# Patient Record
Sex: Female | Born: 1960 | Race: White | Hispanic: No | Marital: Single | Smoking: Never smoker
Health system: Southern US, Community
[De-identification: ages and names within clinical notes are randomized; demographics above are authoritative.]

## PROBLEM LIST (undated history)

## (undated) DIAGNOSIS — E079 Disorder of thyroid, unspecified: Secondary | ICD-10-CM

## (undated) DIAGNOSIS — M1990 Unspecified osteoarthritis, unspecified site: Secondary | ICD-10-CM

## (undated) DIAGNOSIS — G7000 Myasthenia gravis without (acute) exacerbation: Secondary | ICD-10-CM

## (undated) DIAGNOSIS — E063 Autoimmune thyroiditis: Secondary | ICD-10-CM

## (undated) DIAGNOSIS — R51 Headache: Secondary | ICD-10-CM

## (undated) DIAGNOSIS — R011 Cardiac murmur, unspecified: Secondary | ICD-10-CM

## (undated) DIAGNOSIS — E785 Hyperlipidemia, unspecified: Secondary | ICD-10-CM

## (undated) DIAGNOSIS — R599 Enlarged lymph nodes, unspecified: Secondary | ICD-10-CM

## (undated) DIAGNOSIS — R0981 Nasal congestion: Secondary | ICD-10-CM

## (undated) HISTORY — DX: Autoimmune thyroiditis: E06.3

## (undated) HISTORY — PX: CHOLECYSTECTOMY: SHX55

## (undated) HISTORY — DX: Cardiac murmur, unspecified: R01.1

## (undated) HISTORY — DX: Disorder of thyroid, unspecified: E07.9

## (undated) HISTORY — DX: Unspecified osteoarthritis, unspecified site: M19.90

## (undated) HISTORY — DX: Hyperlipidemia, unspecified: E78.5

## (undated) HISTORY — DX: Headache: R51

## (undated) HISTORY — DX: Nasal congestion: R09.81

## (undated) HISTORY — DX: Enlarged lymph nodes, unspecified: R59.9

---
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
## 2011-01-31 ENCOUNTER — Other Ambulatory Visit (INDEPENDENT_AMBULATORY_CARE_PROVIDER_SITE_OTHER): Payer: Self-pay | Admitting: Surgery

## 2011-01-31 DIAGNOSIS — E049 Nontoxic goiter, unspecified: Secondary | ICD-10-CM

## 2011-01-31 DIAGNOSIS — E063 Autoimmune thyroiditis: Secondary | ICD-10-CM

## 2011-02-07 ENCOUNTER — Ambulatory Visit
Admission: RE | Admit: 2011-02-07 | Discharge: 2011-02-07 | Disposition: A | Discharge status: Home / Self Care | Payer: BC Managed Care – PPO | Source: Ambulatory Visit | Attending: Surgery | Admitting: Surgery

## 2011-02-07 DIAGNOSIS — E063 Autoimmune thyroiditis: Secondary | ICD-10-CM

## 2011-02-07 DIAGNOSIS — E049 Nontoxic goiter, unspecified: Secondary | ICD-10-CM

## 2011-06-26 ENCOUNTER — Other Ambulatory Visit: Payer: Self-pay | Admitting: Cardiology

## 2011-06-26 ENCOUNTER — Ambulatory Visit
Admission: RE | Admit: 2011-06-26 | Discharge: 2011-06-26 | Disposition: A | Discharge status: Home / Self Care | Payer: Self-pay | Source: Ambulatory Visit | Attending: Cardiology | Admitting: Cardiology

## 2011-07-11 ENCOUNTER — Other Ambulatory Visit (INDEPENDENT_AMBULATORY_CARE_PROVIDER_SITE_OTHER): Payer: Self-pay | Admitting: Surgery

## 2011-07-11 DIAGNOSIS — E049 Nontoxic goiter, unspecified: Secondary | ICD-10-CM

## 2011-07-11 DIAGNOSIS — E063 Autoimmune thyroiditis: Secondary | ICD-10-CM

## 2011-07-14 ENCOUNTER — Ambulatory Visit (INDEPENDENT_AMBULATORY_CARE_PROVIDER_SITE_OTHER): Payer: Self-pay | Admitting: Surgery

## 2011-07-14 ENCOUNTER — Ambulatory Visit
Admission: RE | Admit: 2011-07-14 | Discharge: 2011-07-14 | Disposition: A | Discharge status: Home / Self Care | Payer: BC Managed Care – PPO | Source: Ambulatory Visit | Attending: Surgery | Admitting: Surgery

## 2011-07-14 DIAGNOSIS — E049 Nontoxic goiter, unspecified: Secondary | ICD-10-CM

## 2011-07-14 DIAGNOSIS — E063 Autoimmune thyroiditis: Secondary | ICD-10-CM

## 2011-07-16 ENCOUNTER — Encounter (INDEPENDENT_AMBULATORY_CARE_PROVIDER_SITE_OTHER): Payer: Self-pay | Admitting: Surgery

## 2011-07-16 ENCOUNTER — Ambulatory Visit (INDEPENDENT_AMBULATORY_CARE_PROVIDER_SITE_OTHER): Payer: BC Managed Care – PPO | Admitting: Surgery

## 2011-07-16 VITALS — BP 92/58 | HR 78 | Temp 98.8°F | Ht 62.0 in | Wt 133.0 lb

## 2011-07-16 DIAGNOSIS — E049 Nontoxic goiter, unspecified: Secondary | ICD-10-CM

## 2011-07-16 DIAGNOSIS — E040 Nontoxic diffuse goiter: Secondary | ICD-10-CM | POA: Insufficient documentation

## 2011-07-16 MED ORDER — SYNTHROID 150 MCG PO TABS: 150.0000 ug | ORAL_TABLET | Freq: Every day | ORAL | Status: DC

## 2011-07-16 NOTE — Progress Notes (Signed)
Visit Diagnoses: 1. Goiter diffuse, heterogeneous     HISTORY: Patient is a 50 year old white female followed for thyroid goiter and likely underlying Hashimoto's thyroiditis. Patient is taking Levoxyl 125 mcg daily. Recent laboratory studies showed a TSH level of 9.466 on her present dose of Levoxyl. Vitamin D level was normal at 31.  Thyroid ultrasound was performed July 14, 2011. This shows an enlarged right thyroid lobe measuring 8.2 cm with heterogeneous echo texture. Left lobe was enlarged at 6.8 cm with heterogeneous echo texture. Isthmus is thickened at 0.7 cm. No nodules are identified. No significant lymphadenopathy.   PERTINENT REVIEW OF SYSTEMS: Patient denies any compressive symptoms. She denies dysphagia. She denies globus sensation. She denies dyspnea. She denies pain.   EXAM: HEENT: normocephalic; pupils equal and reactive; sclerae clear; dentition good; mucous membranes moist NECK:  Palpation shows a firm thyroid gland greater in size on the right than the left. Isthmus is thickened. No discrete or dominant nodules; symmetric on extension; no palpable anterior or posterior cervical lymphadenopathy; no supraclavicular masses; no tenderness CHEST: clear to auscultation bilaterally without rales, rhonchi, or wheezes CARDIAC: regular rate and rhythm without significant murmur; peripheral pulses are full EXT:  non-tender without edema; no deformity NEURO: no gross focal deficits; no sign of tremor   IMPRESSION: #1 diffuse thyroid goiter without compressive symptoms #2 hypothyroidism likely secondary to underlying thyroiditis   PLAN: The patient and I reviewed the above studies. I am going to increase her dose of Levoxyl to 150 mcg daily. We will check a TSH level in 6 weeks.  Thyroid ultrasound will be repeated in one year.  Patient will return in one year for followup physical exam and review of her ultrasound results   Velora Heckler, MD, FACS General & Endocrine  [facility]

## 2011-07-16 NOTE — Patient Instructions (Signed)
Increase Levoxyl to 150 mcg daily.  TSH level in 6 weeks after dosage change.  TMG

## 2011-08-04 ENCOUNTER — Encounter (INDEPENDENT_AMBULATORY_CARE_PROVIDER_SITE_OTHER): Payer: Self-pay | Admitting: Surgery

## 2012-02-05 ENCOUNTER — Telehealth (INDEPENDENT_AMBULATORY_CARE_PROVIDER_SITE_OTHER): Payer: Self-pay

## 2012-02-05 NOTE — Telephone Encounter (Signed)
Pt called stating they no longer make levoxyl. Pt request refill of synthroid to be called to [place]. I have asked pt to get a pharmacy # for me and Per Dr Gerrit Friends I can call in synthroid 150 mcg.

## 2012-02-06 ENCOUNTER — Telehealth (INDEPENDENT_AMBULATORY_CARE_PROVIDER_SITE_OTHER): Payer: Self-pay

## 2012-02-06 NOTE — Telephone Encounter (Signed)
[facility] [place]. Synthroid #30 one po qday refill x6 [phone] per Dr Ardine Eng request.

## 2012-07-01 ENCOUNTER — Encounter (INDEPENDENT_AMBULATORY_CARE_PROVIDER_SITE_OTHER): Payer: Self-pay

## 2012-07-01 ENCOUNTER — Telehealth (INDEPENDENT_AMBULATORY_CARE_PROVIDER_SITE_OTHER): Payer: Self-pay

## 2012-07-01 NOTE — Telephone Encounter (Signed)
Recall letter mailed to pt. Pt due for thyroid u/s and ov.

## 2012-07-28 ENCOUNTER — Telehealth (INDEPENDENT_AMBULATORY_CARE_PROVIDER_SITE_OTHER): Payer: Self-pay

## 2012-07-28 NOTE — Telephone Encounter (Signed)
Left msg with pts Mother pt needs to call prior to appt 11-25, testing is due.

## 2012-08-02 ENCOUNTER — Encounter (INDEPENDENT_AMBULATORY_CARE_PROVIDER_SITE_OTHER): Payer: BC Managed Care – PPO | Admitting: Surgery

## 2012-09-09 ENCOUNTER — Telehealth (INDEPENDENT_AMBULATORY_CARE_PROVIDER_SITE_OTHER): Payer: Self-pay | Admitting: General Surgery

## 2012-09-09 ENCOUNTER — Other Ambulatory Visit (INDEPENDENT_AMBULATORY_CARE_PROVIDER_SITE_OTHER): Payer: Self-pay

## 2012-09-09 ENCOUNTER — Telehealth (INDEPENDENT_AMBULATORY_CARE_PROVIDER_SITE_OTHER): Payer: Self-pay

## 2012-09-09 DIAGNOSIS — E041 Nontoxic single thyroid nodule: Secondary | ICD-10-CM

## 2012-09-09 NOTE — Telephone Encounter (Signed)
Pt returned call. Pt advised need thyroid u/s prior to appt. Pt will call [facility] and set up u/s.

## 2012-09-09 NOTE — Telephone Encounter (Signed)
Called and left message for patient to advise that based on her prior message, she does have to proceed with having the ultrasound before her appointment to see Dr. Gerrit Friends on 09/13/12. Advised if there are any additional questions to please call back.

## 2012-09-10 ENCOUNTER — Ambulatory Visit
Admission: RE | Admit: 2012-09-10 | Discharge: 2012-09-10 | Disposition: A | Discharge status: Home / Self Care | Payer: BC Managed Care – PPO | Source: Ambulatory Visit | Attending: Surgery | Admitting: Surgery

## 2012-09-10 DIAGNOSIS — E041 Nontoxic single thyroid nodule: Secondary | ICD-10-CM

## 2012-09-13 ENCOUNTER — Telehealth (INDEPENDENT_AMBULATORY_CARE_PROVIDER_SITE_OTHER): Payer: Self-pay

## 2012-09-13 ENCOUNTER — Encounter (INDEPENDENT_AMBULATORY_CARE_PROVIDER_SITE_OTHER): Payer: Self-pay | Admitting: Surgery

## 2012-09-13 ENCOUNTER — Ambulatory Visit (INDEPENDENT_AMBULATORY_CARE_PROVIDER_SITE_OTHER): Payer: BC Managed Care – PPO | Admitting: Surgery

## 2012-09-13 VITALS — BP 88/62 | HR 64 | Temp 98.5°F | Ht 62.0 in | Wt 120.6 lb

## 2012-09-13 DIAGNOSIS — E040 Nontoxic diffuse goiter: Secondary | ICD-10-CM

## 2012-09-13 DIAGNOSIS — E049 Nontoxic goiter, unspecified: Secondary | ICD-10-CM

## 2012-09-13 NOTE — Progress Notes (Signed)
General Surgery [facility]  Visit Diagnoses: 1. Goiter diffuse, heterogeneous     HISTORY: The patient is a 52 year old white female followed in my practice for thyroid goiter and presumed Hashimoto's thyroiditis with hypothyroidism. Patient returns for annual followup. She continues to take Synthroid 150 mcg daily. She has not had a recent TSH level. She has no new complaints.  At my request the patient underwent a followup thyroid ultrasound on 09/10/2012. This shows a heterogeneous thyroid gland with no measurable solid or cystic nodules. There has been interval decrease in size of the gland overall. No lymphadenopathy was identified.  PERTINENT REVIEW OF SYSTEMS: Denies compressive symptoms. Denies tremor. Denies palpitations. Denies any voice changes.  EXAM: HEENT: normocephalic; pupils equal and reactive; sclerae clear; dentition good; mucous membranes moist NECK:  Large right thyroid lobe, moderately firm, nontender, no discrete or dominant mass; left lobe is smaller in size without dominant or discrete mass and is also nontender; asymmetric on extension; no palpable anterior or posterior cervical lymphadenopathy; no supraclavicular masses; no tenderness CHEST: clear to auscultation bilaterally without rales, rhonchi, or wheezes CARDIAC: regular rate and rhythm without significant murmur; peripheral pulses are full EXT:  non-tender without edema; no deformity NEURO: no gross focal deficits; no sign of tremor   IMPRESSION: #1 diffuse thyroid goiter, nontoxic #2 probable Hashimoto's thyroiditis with hypothyroidism  PLAN: Patient will have a TSH level drawn in the near future. We will review that result and contact her. Patient will have a follow-up thyroid ultrasound in one year. I will see her back following that study for thyroid examination.  Velora Heckler, MD, [facility] Office: [phone]   2

## 2012-09-13 NOTE — Telephone Encounter (Signed)
Pt advised eye md is Dr Mckinley Jewel per Dr Gerrit Friends. Pt given phone #.

## 2012-09-13 NOTE — Telephone Encounter (Signed)
The pt wanted a call back regarding a referral Dr Gerrit Friends mentioned to an eye dr.  He didn't give her all the information.

## 2012-09-13 NOTE — Patient Instructions (Signed)

## 2012-09-13 NOTE — Telephone Encounter (Signed)
Question forwarded to Dr Gerrit Friends.

## 2012-12-19 ENCOUNTER — Other Ambulatory Visit: Payer: Self-pay | Admitting: Internal Medicine

## 2012-12-21 ENCOUNTER — Telehealth (INDEPENDENT_AMBULATORY_CARE_PROVIDER_SITE_OTHER): Payer: Self-pay | Admitting: *Deleted

## 2012-12-21 NOTE — Telephone Encounter (Signed)
Attempted to call patient's PMD to request TSH panel however message on on machine states the entire office will be closed all week and will not reopen until Monday 12/27/12.

## 2012-12-21 NOTE — Telephone Encounter (Signed)
Called in Synthroid Take 1 tablet daily. #30 with 3 refills.  [facility] [phone].  Patient updated on prescription at this time.

## 2012-12-21 NOTE — Telephone Encounter (Signed)
[name]:  It is OK to give Alexis Armstrong 3 more refills on her current dose of Synthroid.  We need to get a copy of her TSH level for our records too.  Thanks for taking care of this.  tmg  Velora Heckler, MD, [facility] Office: [phone]

## 2012-12-21 NOTE — Telephone Encounter (Signed)
Patient called to ask for a refill on her Synthroid.  Patient states she had a TSH panel done last week however when she called her PMD to get the refill based on those results she was told her PMD is out of the office for the rest of the week.  Patient last took Synthroid yesterday.

## 2012-12-27 NOTE — Telephone Encounter (Signed)
Spoke to [place] at [facility] who will fax over TSH results.

## 2013-01-03 ENCOUNTER — Ambulatory Visit
Admission: RE | Admit: 2013-01-03 | Discharge: 2013-01-03 | Disposition: A | Discharge status: Home / Self Care | Payer: BC Managed Care – PPO | Source: Ambulatory Visit | Attending: Nurse Practitioner | Admitting: Nurse Practitioner

## 2013-01-03 ENCOUNTER — Other Ambulatory Visit: Payer: Self-pay | Admitting: Nurse Practitioner

## 2013-01-03 DIAGNOSIS — K8020 Calculus of gallbladder without cholecystitis without obstruction: Secondary | ICD-10-CM

## 2013-01-03 DIAGNOSIS — R52 Pain, unspecified: Secondary | ICD-10-CM

## 2013-01-03 MED ORDER — IOHEXOL 300 MG/ML  SOLN
100.0000 mL | Freq: Once | INTRAMUSCULAR | Status: AC | PRN
  Administered 2013-01-03: 100 mL via INTRAVENOUS

## 2013-01-06 ENCOUNTER — Encounter (INDEPENDENT_AMBULATORY_CARE_PROVIDER_SITE_OTHER): Payer: Self-pay

## 2013-02-24 ENCOUNTER — Other Ambulatory Visit: Payer: Self-pay | Admitting: Nurse Practitioner

## 2013-02-24 DIAGNOSIS — R109 Unspecified abdominal pain: Secondary | ICD-10-CM

## 2013-02-28 ENCOUNTER — Ambulatory Visit
Admission: RE | Admit: 2013-02-28 | Discharge: 2013-02-28 | Disposition: A | Discharge status: Home / Self Care | Payer: BC Managed Care – PPO | Source: Ambulatory Visit | Attending: Nurse Practitioner | Admitting: Nurse Practitioner

## 2013-02-28 ENCOUNTER — Other Ambulatory Visit: Payer: Self-pay | Admitting: Gastroenterology

## 2013-02-28 ENCOUNTER — Encounter: Payer: Self-pay | Admitting: Gastroenterology

## 2013-02-28 DIAGNOSIS — R109 Unspecified abdominal pain: Secondary | ICD-10-CM

## 2013-03-02 ENCOUNTER — Ambulatory Visit
Admission: RE | Admit: 2013-03-02 | Discharge: 2013-03-02 | Disposition: A | Discharge status: Home / Self Care | Payer: BC Managed Care – PPO | Source: Ambulatory Visit | Attending: Gastroenterology | Admitting: Gastroenterology

## 2013-03-02 DIAGNOSIS — R109 Unspecified abdominal pain: Secondary | ICD-10-CM

## 2013-03-02 MED ORDER — IOHEXOL 300 MG/ML  SOLN
80.0000 mL | Freq: Once | INTRAMUSCULAR | Status: AC | PRN
  Administered 2013-03-02: 80 mL via INTRAVENOUS

## 2013-03-10 ENCOUNTER — Ambulatory Visit: Payer: BC Managed Care – PPO | Admitting: Gastroenterology

## 2013-08-02 ENCOUNTER — Other Ambulatory Visit (INDEPENDENT_AMBULATORY_CARE_PROVIDER_SITE_OTHER): Payer: Self-pay

## 2013-08-02 ENCOUNTER — Telehealth (INDEPENDENT_AMBULATORY_CARE_PROVIDER_SITE_OTHER): Payer: Self-pay

## 2013-08-02 DIAGNOSIS — E042 Nontoxic multinodular goiter: Secondary | ICD-10-CM

## 2013-08-02 NOTE — Telephone Encounter (Signed)
Pt called stating she is having difficulty staying asleep at night. She states she wakes up at 4 am and cannot go back to sleep. Pt is not sure why she cannot sleep. Pt states she thinks her thyroid nodules have gotten larger. Pt states since she had her GB removal she thinks it has thrown her into early menopause. I advised pt she is due soon for her U/S of thyroid and the order is in epic from last visit for thyroid. I advised pt she can proceed with her U/S if she is concerned about growth of nodules. Pt also request we draw a TSH. Pt last had TSH drawn by her PCP April 2014 by Dr Kathie Rhodes. Toni Arthurs which was 0.30. It is viewable in epic under labs as a scan. Pt advised I will send request to Dr Gerrit Friends to review for ordering a TSH.

## 2013-08-02 NOTE — Telephone Encounter (Signed)
I tried to reach pt at home # but machine is full. Per Dr Gerrit Friends pt can have TSH done. Order in epic and lab slip mailed to pt.

## 2013-08-03 ENCOUNTER — Ambulatory Visit
Admission: RE | Admit: 2013-08-03 | Discharge: 2013-08-03 | Disposition: A | Discharge status: Home / Self Care | Payer: BC Managed Care – PPO | Source: Ambulatory Visit | Attending: Surgery | Admitting: Surgery

## 2013-08-03 DIAGNOSIS — E040 Nontoxic diffuse goiter: Secondary | ICD-10-CM

## 2013-08-03 DIAGNOSIS — E049 Nontoxic goiter, unspecified: Secondary | ICD-10-CM

## 2013-08-09 ENCOUNTER — Telehealth (INDEPENDENT_AMBULATORY_CARE_PROVIDER_SITE_OTHER): Payer: Self-pay

## 2013-08-09 NOTE — Telephone Encounter (Signed)
Received refill request for synthroid from [facility]. Per Dr Gerrit Friends pt advised she needs to have TSH level drawn prior to refill. Pt states she did not receive lab request in the mail. I verified address with pt. Pt advised she needs to go to lab and I can send request to lab or have pt pick up lab slip today. 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 Pt advised we can call rx to [facility] if she needs once she has labs. Pt states she will call back today to advise if she will get labs and when.

## 2013-08-23 ENCOUNTER — Encounter (INDEPENDENT_AMBULATORY_CARE_PROVIDER_SITE_OTHER): Payer: Self-pay | Admitting: Surgery

## 2013-09-12 ENCOUNTER — Other Ambulatory Visit: Payer: BC Managed Care – PPO

## 2013-12-26 ENCOUNTER — Telehealth (INDEPENDENT_AMBULATORY_CARE_PROVIDER_SITE_OTHER): Payer: Self-pay | Admitting: *Deleted

## 2013-12-26 ENCOUNTER — Encounter (INDEPENDENT_AMBULATORY_CARE_PROVIDER_SITE_OTHER): Payer: BC Managed Care – PPO | Admitting: Surgery

## 2013-12-26 ENCOUNTER — Telehealth (INDEPENDENT_AMBULATORY_CARE_PROVIDER_SITE_OTHER): Payer: Self-pay

## 2013-12-26 NOTE — Telephone Encounter (Signed)
Pt called stating she missed her appt because she had the wrong times down. I offered pt appt but pt declined stating she travels with her job and will probably be out of town. Pt wanted to know her U/S result. I did read report to her but explained the importance of a physical exam by her PCP at least. Pt states goiter has gone down in size. Pt states she will try to work out her schedule to see her PCP when she is back in town. Pt states she will call if goiter changes.

## 2013-12-26 NOTE — Telephone Encounter (Signed)
Pt had left a message on my voicemail on 4.17.15 and unfortunately I was out.  I just reviewed it this morning and she has an appt today 4.20.15.@ 10:30 and she hasn't had her labs or any test that Dr. Harlow Asa would have wanted.  I do not see any orders in the chart.  Alexis Armstrong

## 2013-12-27 ENCOUNTER — Encounter (INDEPENDENT_AMBULATORY_CARE_PROVIDER_SITE_OTHER): Payer: Self-pay | Admitting: Surgery

## 2014-05-18 ENCOUNTER — Ambulatory Visit (INDEPENDENT_AMBULATORY_CARE_PROVIDER_SITE_OTHER): Payer: BC Managed Care – PPO | Admitting: Surgery

## 2014-05-18 ENCOUNTER — Other Ambulatory Visit (INDEPENDENT_AMBULATORY_CARE_PROVIDER_SITE_OTHER): Payer: Self-pay

## 2014-05-18 DIAGNOSIS — E042 Nontoxic multinodular goiter: Secondary | ICD-10-CM

## 2014-06-21 LAB — T4: T4 TOTAL: 4.7 ug/dL (ref 4.5–12.0)

## 2014-06-21 LAB — T3: T3 TOTAL: 132.2 ng/dL (ref 80.0–204.0)

## 2014-06-21 LAB — TSH: TSH: 0.118 u[IU]/mL — ABNORMAL LOW (ref 0.350–4.500)

## 2014-06-26 ENCOUNTER — Other Ambulatory Visit (INDEPENDENT_AMBULATORY_CARE_PROVIDER_SITE_OTHER): Payer: Self-pay

## 2014-06-26 DIAGNOSIS — E063 Autoimmune thyroiditis: Secondary | ICD-10-CM

## 2014-06-27 IMAGING — CT CT ABDOMEN W/ CM
3 of 5 series · 17 of 46 positions shown, 19 images · IV contrast (30CC OMNI 300 & [ID] OMNI 300)
Comparison: None.

CLINICAL DATA: Right upper quadrant pain.

CT ABDOMEN WITH CONTRAST
TECHNIQUE: Multidetector CT imaging of the abdomen was performed
following the standard protocol during bolus administration of
intravenous contrast.
Contrast: 100mL OMNIPAQUE IOHEXOL 300 MG/ML  SOLN

[Series 2: abdomen w/ · axial · 0.65mm/px · z∈[-284,-64]mm · 12 of 54 slices shown, 14 images]
[im 5/54  soft-tissue]
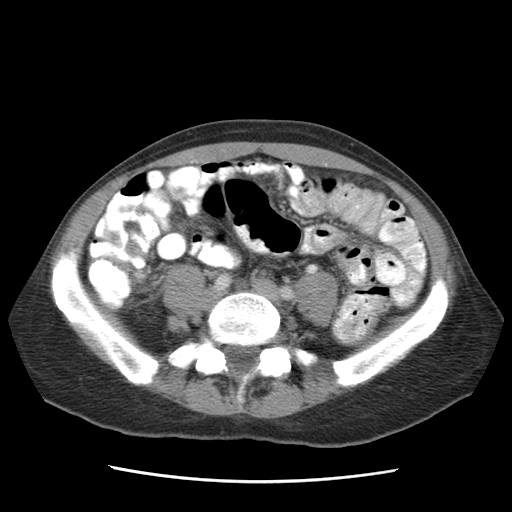
[im 5/54  bone]
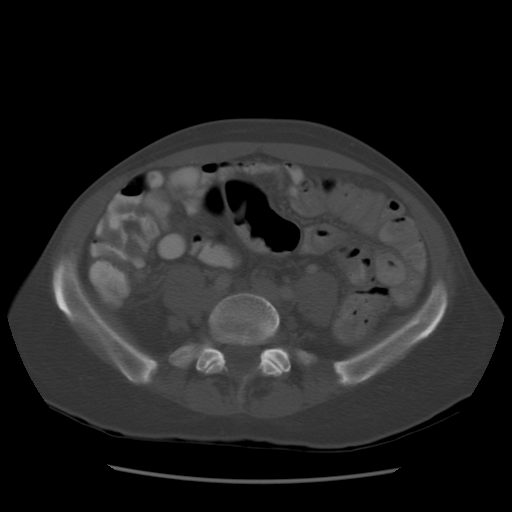
[im 9/54  soft-tissue]
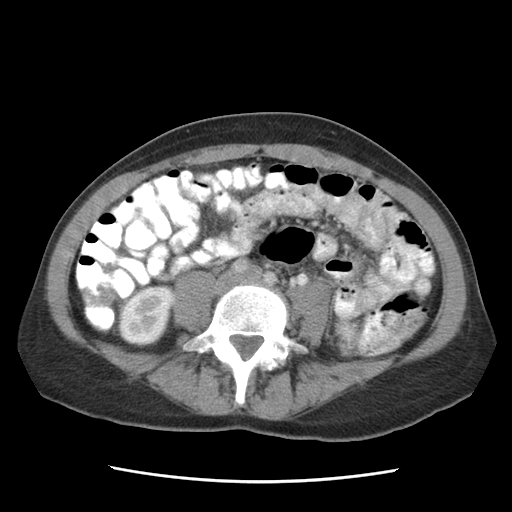
[im 13/54  soft-tissue]
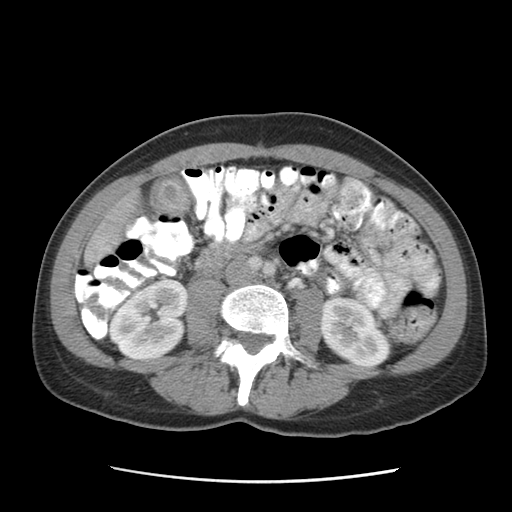
[im 17/54  soft-tissue]
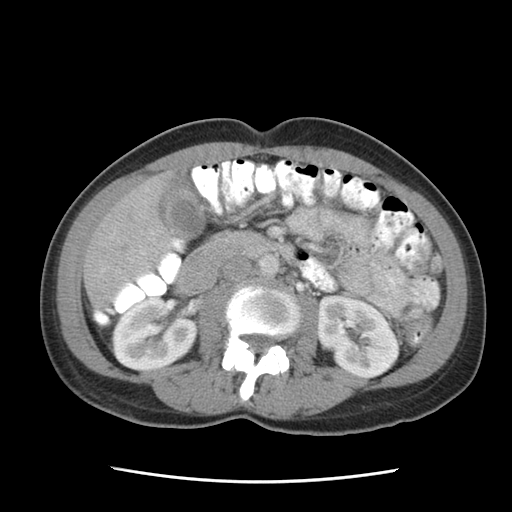
[im 21/54  soft-tissue]
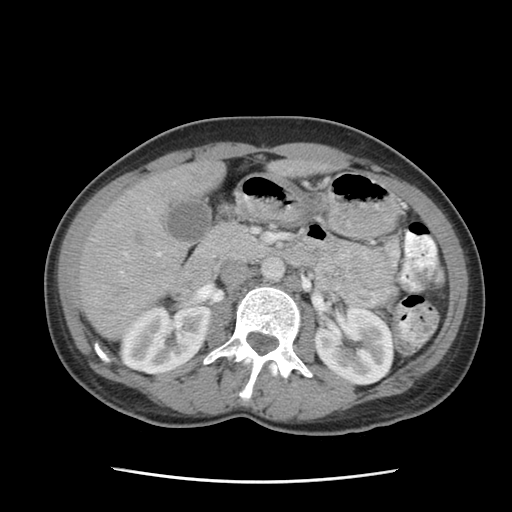
[im 25/54  soft-tissue]
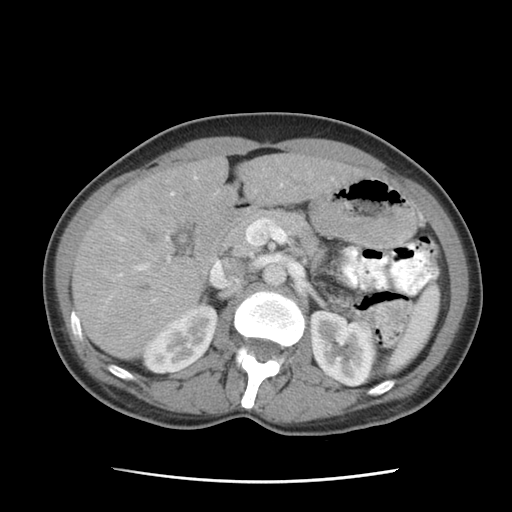
[im 29/54  soft-tissue]
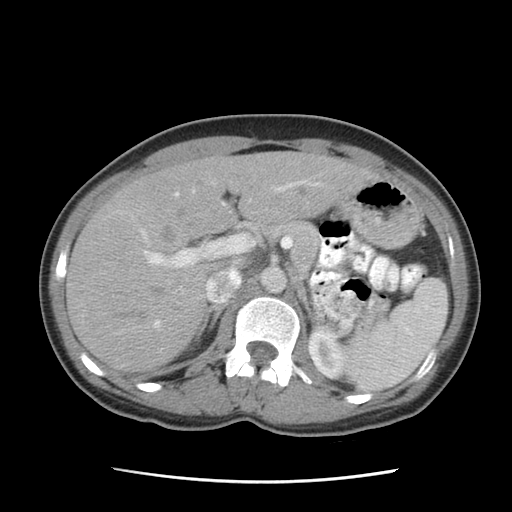
[im 33/54  soft-tissue]
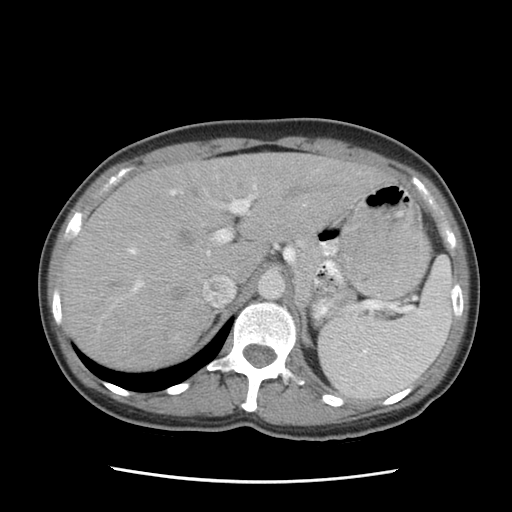
[im 37/54  soft-tissue]
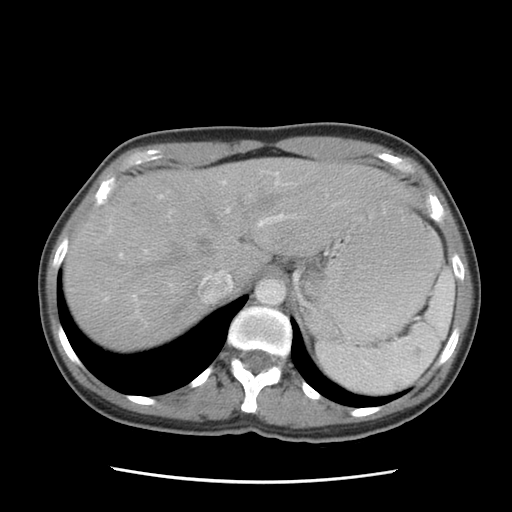
[im 37/54  bone]
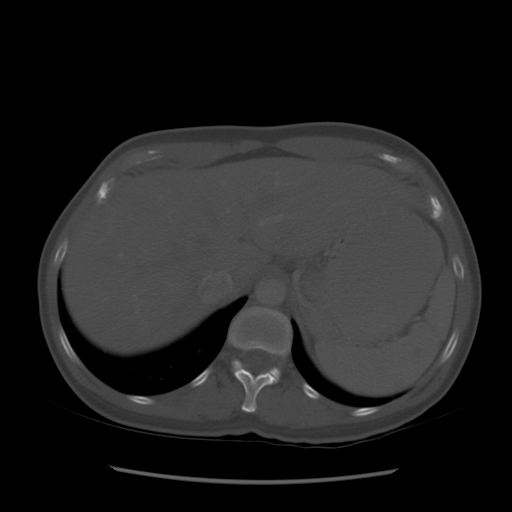
[im 41/54  soft-tissue]
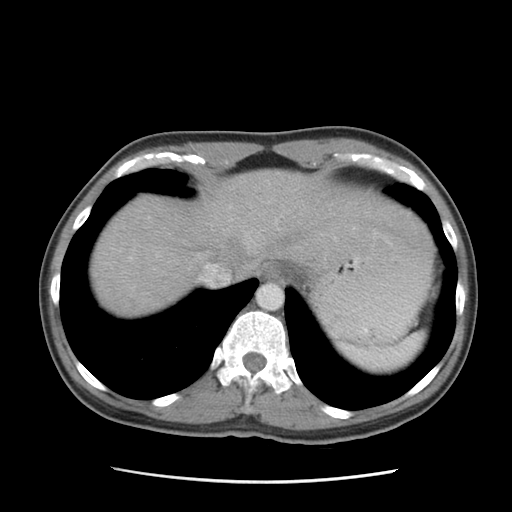
[im 45/54  soft-tissue]
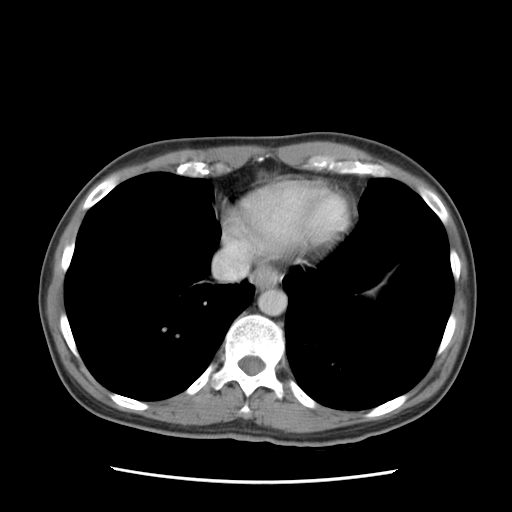
[im 49/54  soft-tissue]
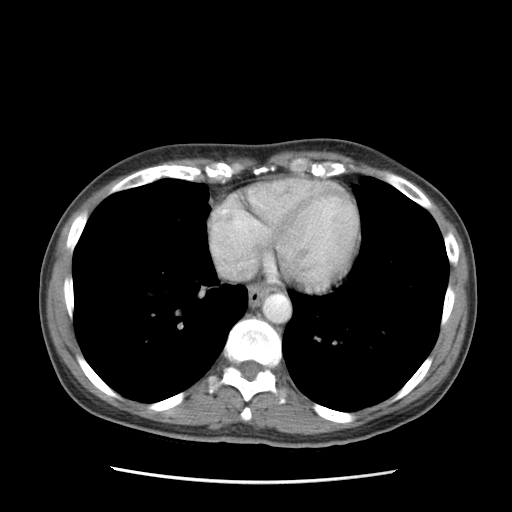

[Series 3: lung windows · axial · 0.64mm/px · z∈[-175,-155]mm · 2 of 31 slices shown]
[im 4/31  bone]
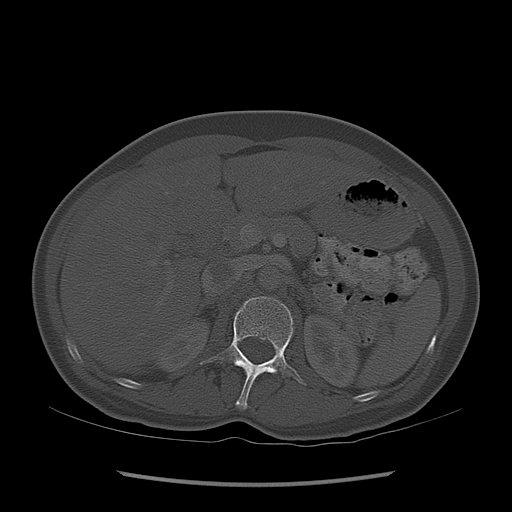
[im 8/31  bone]
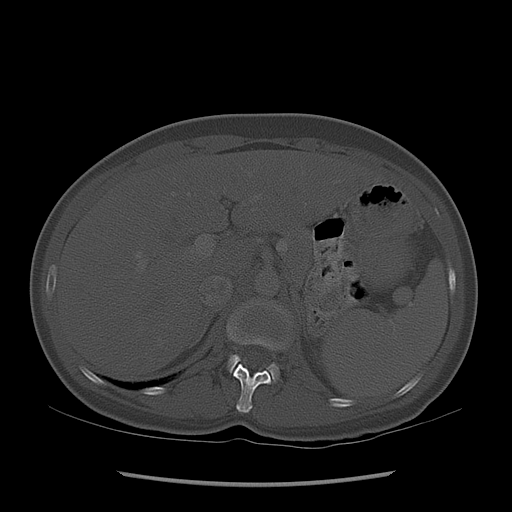

[Series 400: cor · coronal · 0.65mm/px · 3 of 117 slices shown]
[im 39/117  soft-tissue]
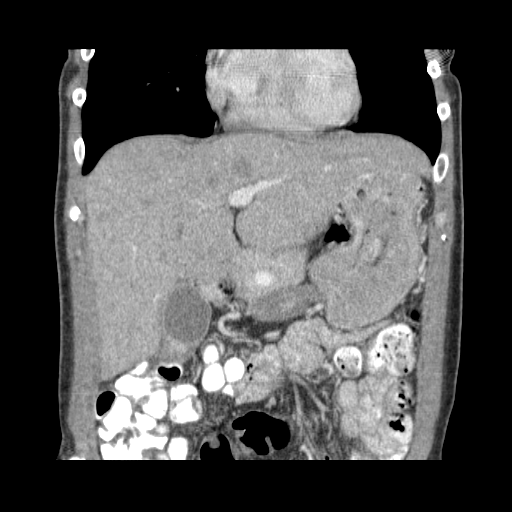
[im 52/117  soft-tissue]
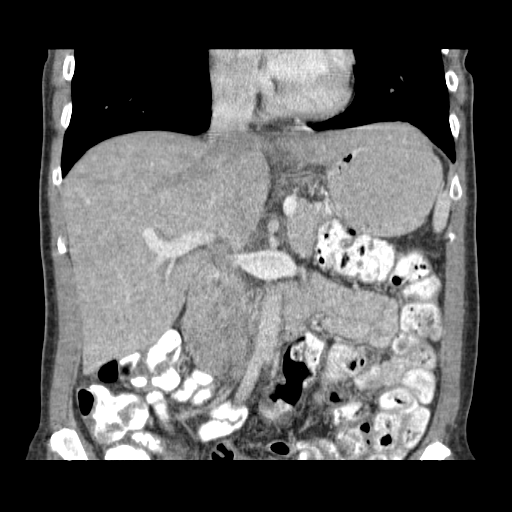
[im 65/117  soft-tissue]
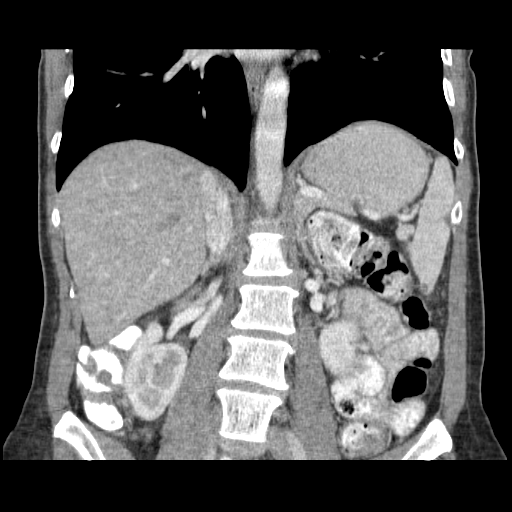

[17 of 46 positions shown; findings below may reference images not displayed]

FINDINGS: Lung bases are clear.  No evidence for free
intraperitoneal air.

There is wall thickening around the gallbladder fundus.  Small
amount of pericholecystic fluid.  Findings are suggestive for acute
gallbladder inflammation.

No gross abnormality to the liver or portal venous system.  Normal
appearance of the pancreas.  There is a 7 mm low density structure
along the upper spleen which is indeterminate but likely an
incidental finding.  Normal appearance of the adrenal glands and
kidneys.  Appendix is partially visualized and filled with oral
contrast.  No gross abnormality to the small or large bowel.  Tiny
low density structure in left kidney probably represents a small
cyst.

No acute bony abnormality.
IMPRESSION: Inflammation centered around the gallbladder fundus.  Findings are
suggestive for acute cholecystitis.  Gallstones are not clearly
identified on CT but this could be further evaluated with
ultrasound.

Sub centimeter low density structure in the spleen as described.

## 2016-11-05 DIAGNOSIS — M4722 Other spondylosis with radiculopathy, cervical region: Secondary | ICD-10-CM | POA: Insufficient documentation

## 2016-11-05 HISTORY — DX: Other spondylosis with radiculopathy, cervical region: M47.22

## 2016-11-20 DIAGNOSIS — H532 Diplopia: Secondary | ICD-10-CM | POA: Insufficient documentation

## 2016-11-20 DIAGNOSIS — H04129 Dry eye syndrome of unspecified lacrimal gland: Secondary | ICD-10-CM | POA: Insufficient documentation

## 2016-11-20 HISTORY — DX: Dry eye syndrome of unspecified lacrimal gland: H04.129

## 2016-12-16 ENCOUNTER — Ambulatory Visit: Payer: BLUE CROSS/BLUE SHIELD | Attending: MOHS-Micrographic Surgery

## 2016-12-17 ENCOUNTER — Ambulatory Visit: Payer: BLUE CROSS/BLUE SHIELD | Attending: MOHS-Micrographic Surgery

## 2016-12-17 DIAGNOSIS — D229 Melanocytic nevi, unspecified: Secondary | ICD-10-CM

## 2016-12-17 DIAGNOSIS — D485 Neoplasm of uncertain behavior of skin: Secondary | ICD-10-CM

## 2016-12-24 ENCOUNTER — Telehealth: Payer: BLUE CROSS/BLUE SHIELD

## 2016-12-31 ENCOUNTER — Ambulatory Visit: Payer: BLUE CROSS/BLUE SHIELD | Attending: Rheumatology

## 2016-12-31 ENCOUNTER — Ambulatory Visit: Payer: BLUE CROSS/BLUE SHIELD

## 2016-12-31 ENCOUNTER — Telehealth: Payer: BLUE CROSS/BLUE SHIELD

## 2016-12-31 DIAGNOSIS — H527 Unspecified disorder of refraction: Secondary | ICD-10-CM

## 2017-01-19 MED ORDER — VALACYCLOVIR HCL 500 MG PO TABS
ORAL_TABLET | 0 refills | Status: AC
Start: 2017-01-19 — End: 2017-03-03

## 2017-02-24 ENCOUNTER — Ambulatory Visit (HOSPITAL_COMMUNITY)
Admission: EM | Admit: 2017-02-24 | Discharge: 2017-02-24 | Disposition: A | Discharge status: Home / Self Care | Payer: Self-pay | Attending: Family Medicine | Admitting: Family Medicine

## 2017-02-24 ENCOUNTER — Encounter (HOSPITAL_COMMUNITY): Payer: Self-pay | Admitting: Emergency Medicine

## 2017-02-24 DIAGNOSIS — H6992 Unspecified Eustachian tube disorder, left ear: Secondary | ICD-10-CM

## 2017-02-24 DIAGNOSIS — R05 Cough: Secondary | ICD-10-CM

## 2017-02-24 DIAGNOSIS — J683 Other acute and subacute respiratory conditions due to chemicals, gases, fumes and vapors: Secondary | ICD-10-CM

## 2017-02-24 DIAGNOSIS — R059 Cough, unspecified: Secondary | ICD-10-CM

## 2017-02-24 DIAGNOSIS — H6982 Other specified disorders of Eustachian tube, left ear: Secondary | ICD-10-CM

## 2017-02-24 DIAGNOSIS — J209 Acute bronchitis, unspecified: Secondary | ICD-10-CM

## 2017-02-24 DIAGNOSIS — R0789 Other chest pain: Secondary | ICD-10-CM

## 2017-02-24 DIAGNOSIS — R062 Wheezing: Secondary | ICD-10-CM

## 2017-02-24 MED ORDER — NAPROXEN 500 MG PO TABS: 500.0000 mg | ORAL_TABLET | Freq: Two times a day (BID) | ORAL | 0 refills | Status: DC

## 2017-02-24 MED ORDER — AZITHROMYCIN 250 MG PO TABS: 250.0000 mg | ORAL_TABLET | Freq: Every day | ORAL | 0 refills | Status: DC

## 2017-02-24 MED ORDER — ALBUTEROL SULFATE HFA 108 (90 BASE) MCG/ACT IN AERS: 1.0000 | INHALATION_SPRAY | Freq: Four times a day (QID) | RESPIRATORY_TRACT | 0 refills | Status: DC | PRN

## 2017-02-24 MED ORDER — ALBUTEROL SULFATE (2.5 MG/3ML) 0.083% IN NEBU
2.5000 mg | INHALATION_SOLUTION | Freq: Once | RESPIRATORY_TRACT | Status: AC
  Administered 2017-02-24: 2.5 mg via RESPIRATORY_TRACT

## 2017-02-24 MED ORDER — BENZONATATE 100 MG PO CAPS: 200.0000 mg | ORAL_CAPSULE | Freq: Three times a day (TID) | ORAL | 0 refills | Status: DC | PRN

## 2017-02-24 MED ORDER — IPRATROPIUM BROMIDE 0.06 % NA SOLN: 2.0000 | Freq: Four times a day (QID) | NASAL | 0 refills | Status: DC

## 2017-02-24 MED ORDER — ALBUTEROL SULFATE (2.5 MG/3ML) 0.083% IN NEBU
INHALATION_SOLUTION | RESPIRATORY_TRACT | Status: AC
Start: 2017-02-24 — End: 2017-02-24
  Filled 2017-02-24: qty 3

## 2017-02-24 NOTE — ED Provider Notes (Signed)
CSN: [id number]     Arrival date & time 02/24/17  1053 History   First MD Initiated Contact with Patient 02/24/17 1129     Chief Complaint  Patient presents with  . Cough   (Consider location/radiation/quality/duration/timing/severity/associated sxs/prior Treatment) Patient c/o persistent cough and wheezing.  C/o sinus congestion, chest congestion, left chest wall pain and left ear discomfort.  She has had sx's for over a week.  She states she has been told in the past she has hx of mild asthma.  She also states she is allergic to steroids.  She states she feels like her axilla is swollen and she has some left axillary discomfort.   The history is provided by the patient.  Cough  Cough characteristics:  Productive Sputum characteristics:  Yellow Severity:  Moderate Onset quality:  Sudden Duration:  1 week Timing:  Constant Progression:  Worsening Chronicity:  New Smoker: no   Context: upper respiratory infection and weather changes   Relieved by:  None tried Worsened by:  Activity, deep breathing, exposure to cold air and environmental changes Ineffective treatments:  None tried Associated symptoms: chest pain, ear fullness, ear pain, rhinorrhea, shortness of breath, sinus congestion and wheezing     Past Medical History:  Diagnosis Date  . Abdominal pain   . Arthritis   . Headache(784.0)   . Heart murmur   . Hyperlipidemia   . Lymph node(s) enlarged   . Nasal congestion   . Thyroid disease    Past Surgical History:  Procedure Laterality Date  . CHOLECYSTECTOMY    . glandular staff infection  1970   Family History  Problem Relation Age of Onset  . Heart disease Father   . Cancer Maternal Grandmother        breast  . Cancer Paternal Grandmother        colon   Social History  Substance Use Topics  . Smoking status: Never Smoker  . Smokeless tobacco: Not on file  . Alcohol use Yes     Comment: socially/2x month   OB History    No data available     Review  of Systems  Constitutional: Positive for fatigue.  HENT: Positive for ear pain and rhinorrhea.   Eyes: Negative.   Respiratory: Positive for cough, shortness of breath and wheezing.   Cardiovascular: Positive for chest pain.  Gastrointestinal: Negative.   Endocrine: Negative.   Genitourinary: Negative.   Musculoskeletal: Negative.   Allergic/Immunologic: Negative.   Neurological: Negative.   Hematological: Negative.   Psychiatric/Behavioral: Negative.     Allergies  Prednisone and Latex  Home Medications   Prior to Admission medications   Medication Sig Start Date End Date Taking? Authorizing Provider  Levothyroxine Sodium (TIROSINT) 100 MCG CAPS Take by mouth daily before breakfast.   Yes [provider]  albuterol (PROVENTIL HFA;VENTOLIN HFA) 108 (90 Base) MCG/ACT inhaler Inhale 1-2 puffs into the lungs every 6 (six) hours as needed for wheezing or shortness of breath. 02/24/17   Lysbeth Penner, FNP  azithromycin (ZITHROMAX) 250 MG tablet Take 1 tablet (250 mg total) by mouth daily. Take first 2 tablets together, then 1 every day until finished. 02/24/17   Lysbeth Penner, FNP  benzonatate (TESSALON) 100 MG capsule Take 2 capsules (200 mg total) by mouth 3 (three) times daily as needed for cough. 02/24/17   Lysbeth Penner, FNP  ipratropium (ATROVENT) 0.06 % nasal spray Place 2 sprays into both nostrils 4 (four) times daily. 02/24/17   Shad Ledvina,  Orson Ape, FNP  naproxen (NAPROSYN) 500 MG tablet Take 1 tablet (500 mg total) by mouth 2 (two) times daily with a meal. 02/24/17   Izabella Marcantel, Orson Ape, FNP  valACYclovir (VALTREX) 500 MG tablet  09/02/12   [provider]   Meds Ordered and Administered this Visit   Medications  albuterol (PROVENTIL) (2.5 MG/3ML) 0.083% nebulizer solution 2.5 mg (2.5 mg Nebulization Given 02/24/17 1146)    BP 111/69 (BP Location: Right Arm)   Pulse 66   Temp 98.3 F (36.8 C) (Oral)   Resp 18   LMP 12/21/2012   SpO2 99%  No data  found.   Physical Exam  Constitutional: She appears well-developed and well-nourished.  HENT:  Head: Normocephalic and atraumatic.  Right Ear: External ear normal.  Left Ear: External ear normal.  Mouth/Throat: Oropharynx is clear and moist.  Eyes: Conjunctivae and EOM are normal. Pupils are equal, round, and reactive to light.  Neck: Normal range of motion. Neck supple.  Cardiovascular: Normal rate, regular rhythm and normal heart sounds.   Pulmonary/Chest: Effort normal. She has wheezes.  Musculoskeletal: She exhibits tenderness.  TTP left chest wall and intercostals.  C/o tenderness with palpation left axilla no nodes appreciated and no masses.  Nursing note and vitals reviewed.   Urgent Care Course     Procedures (including critical care time)  Labs Review Labs Reviewed - No data to display  Imaging Review No results found.   Visual Acuity Review  Right Eye Distance:   Left Eye Distance:   Bilateral Distance:    Right Eye Near:   Left Eye Near:    Bilateral Near:         MDM   1. Acute bronchitis, unspecified organism   2. Cough   3. Reactive airways dysfunction syndrome ([place])   4. Left-sided chest wall pain   5. ETD (Eustachian tube dysfunction), left    Neb treatment in clinic  Zpak as directed Naprosyn 500mg  one po bid x 7 days Alburterol MDI Tessalon Perles Atrovent Nasal spray  Push po fluids, rest, tylenol and motrin otc prn as directed for fever, arthralgias, and myalgias.  Follow up prn if sx's continue or persist.    Lysbeth Penner, FNP 02/24/17 1214

## 2017-02-24 NOTE — ED Triage Notes (Signed)
The patient presented to the [facility] with a complaint of a cough and congestion x 1 week.

## 2017-03-02 MED ORDER — VALACYCLOVIR HCL 500 MG PO TABS
ORAL_TABLET | 0 refills | Status: AC
Start: 2017-03-02 — End: 2017-06-11

## 2017-06-11 MED ORDER — VALACYCLOVIR HCL 500 MG PO TABS
ORAL_TABLET | 0 refills | Status: AC
Start: 2017-06-11 — End: 2017-08-06

## 2017-06-17 ENCOUNTER — Ambulatory Visit: Payer: BLUE CROSS/BLUE SHIELD | Attending: MOHS-Micrographic Surgery

## 2017-08-06 MED ORDER — VALACYCLOVIR HCL 500 MG PO TABS
ORAL_TABLET | 0 refills | Status: AC
Start: 2017-08-06 — End: 2017-09-02

## 2017-08-24 ENCOUNTER — Ambulatory Visit: Payer: BLUE CROSS/BLUE SHIELD | Admitting: Podiatry

## 2017-08-24 ENCOUNTER — Encounter: Payer: Self-pay | Admitting: Podiatry

## 2017-08-24 ENCOUNTER — Ambulatory Visit (INDEPENDENT_AMBULATORY_CARE_PROVIDER_SITE_OTHER): Payer: BLUE CROSS/BLUE SHIELD

## 2017-08-24 DIAGNOSIS — M79671 Pain in right foot: Secondary | ICD-10-CM | POA: Diagnosis not present

## 2017-08-24 DIAGNOSIS — M79672 Pain in left foot: Secondary | ICD-10-CM

## 2017-08-24 DIAGNOSIS — M722 Plantar fascial fibromatosis: Secondary | ICD-10-CM

## 2017-08-24 DIAGNOSIS — M779 Enthesopathy, unspecified: Secondary | ICD-10-CM | POA: Diagnosis not present

## 2017-08-24 MED ORDER — DICLOFENAC SODIUM 75 MG PO TBEC: 75.0000 mg | DELAYED_RELEASE_TABLET | Freq: Two times a day (BID) | ORAL | 2 refills | Status: DC

## 2017-08-24 NOTE — Patient Instructions (Signed)

## 2017-08-25 ENCOUNTER — Telehealth: Payer: Self-pay | Admitting: Podiatry

## 2017-08-25 NOTE — Progress Notes (Signed)
Subjective:   Patient ID: Alexis Armstrong, female   DOB: 56 y.o.   MRN: [id number]   HPI Patient presents stating she developed a lot of pain in her foot left over right and she is very busy in certain seasons but has not been active the last month but it has not helped.  Appears to be mostly in the heel but also the forefoot bilateral and states that she does think that she is allergic to almost all steroids and also has other allergies that causes her to have trouble breathing   Review of Systems  All other systems reviewed and are negative.       Objective:  Physical Exam  Constitutional: She appears well-developed and well-nourished.  Cardiovascular: Intact distal pulses.  Pulmonary/Chest: Effort normal.  Musculoskeletal: Normal range of motion.  Neurological: She is alert.  Skin: Skin is warm.  Nursing note and vitals reviewed.   Neurovascular status intact muscle strength adequate range of motion within normal limits with patient found to have exquisite discomfort in the heel region left over right with inflammation fluid moderate depression of the arch structural bunion deformity left over right and inflammation of the lesser MPJs bilateral.  Patient is noted to have good digital perfusion and is well oriented x3     Assessment:  Inflammatory fasciitis left over right heel with depression of the arch and probable compensatory capsulitis with structural bunion deformity noted     Plan:  H&P x-rays reviewed conditions discussed.  At this point I did dispense fascial brace is bilateral gave instructions on physical therapy placed on oral anti-inflammatory diclofenac and discussed that I love to do an injection but do not feel comfortable given her history of allergy.  I went ahead and scanned for custom orthotics to try to reduce plantar pressure on her feet and she may require other treatments depending on response  X-rays indicate there is spur formation and there is structural  bunion deformity left over right with no signs of stress fracture or advanced arthritis

## 2017-08-25 NOTE — Telephone Encounter (Signed)
Called pt to let her know that her requested office visit notes and CD of x-rays from her visit yesterday, Monday 24 August 2017 were ready for her to pick up at the front desk. I told her in case she was not notified that there is a $5.00 charge for the CD of x-rays. Pt asked what time we closed today to which I replied 5:00 pm. I told her we were open 8:00 am - 5:00 pm tomorrow and Thursday and then we are open 7:30 am -4:00 pm this Friday.

## 2017-09-02 MED ORDER — VALACYCLOVIR HCL 500 MG PO TABS
ORAL_TABLET | 0 refills | Status: AC
Start: 2017-09-02 — End: 2017-09-30

## 2017-09-09 ENCOUNTER — Other Ambulatory Visit: Payer: Self-pay | Admitting: Podiatry

## 2017-09-09 DIAGNOSIS — M722 Plantar fascial fibromatosis: Secondary | ICD-10-CM

## 2017-09-18 ENCOUNTER — Telehealth: Payer: Self-pay | Admitting: Podiatry

## 2017-09-18 NOTE — Telephone Encounter (Signed)
I was in the office several weeks ago and was diagnosed with plantar fasciitis. I had two braces for my feet and while I was on the road I think my friend threw them away by accident. I was calling to see if I could get my hands on some more quickly because [employer] takes over a week and they really helped me. Call me back at this number [phone]. Thank you.

## 2017-09-29 MED ORDER — VALACYCLOVIR HCL 500 MG PO TABS
ORAL_TABLET | 0 refills | Status: AC
Start: 2017-09-29 — End: 2017-10-28

## 2017-10-28 MED ORDER — VALACYCLOVIR HCL 500 MG PO TABS
ORAL_TABLET | 0 refills | Status: AC
Start: 2017-10-28 — End: 2018-01-28

## 2017-10-28 MED ORDER — VALACYCLOVIR HCL 500 MG PO TABS
ORAL_TABLET | 0 refills | Status: AC
Start: 2017-10-28 — End: 2017-12-26

## 2017-12-25 MED ORDER — VALACYCLOVIR HCL 500 MG PO TABS
ORAL_TABLET | 0 refills | Status: AC
Start: 2017-12-25 — End: 2018-01-01

## 2017-12-31 ENCOUNTER — Ambulatory Visit: Payer: BLUE CROSS/BLUE SHIELD

## 2017-12-31 DIAGNOSIS — E039 Hypothyroidism, unspecified: Secondary | ICD-10-CM | POA: Insufficient documentation

## 2017-12-31 DIAGNOSIS — R899 Unspecified abnormal finding in specimens from other organs, systems and tissues: Secondary | ICD-10-CM

## 2017-12-31 DIAGNOSIS — E038 Other specified hypothyroidism: Secondary | ICD-10-CM

## 2017-12-31 DIAGNOSIS — R7303 Prediabetes: Secondary | ICD-10-CM

## 2017-12-31 DIAGNOSIS — M722 Plantar fascial fibromatosis: Secondary | ICD-10-CM

## 2017-12-31 DIAGNOSIS — E063 Autoimmune thyroiditis: Secondary | ICD-10-CM

## 2017-12-31 DIAGNOSIS — E785 Hyperlipidemia, unspecified: Secondary | ICD-10-CM

## 2017-12-31 NOTE — Progress Notes
Date: 12/31/2017    SUBJECTIVE:     Brittany Meyer is a 57 y.o. female who is here for     Here with foot pain since oct  Started after wearing boots  Saw podiatrist in [place], was given orthotics and a foot brace to wear during the day  She says her feet have been swollen, esp her toes  Worried bout prediabetes    Last set of blood work with  elev tsh, low t4  ldl of >200  tprotein of 8.7  Nl cbc  And a uti    Has put herself on a low carb diet    Fh + for heart disease      Past Medical History:   Diagnosis Date   ??? H/O seasonal allergies    ??? Headache    ??? Hypothyroid        Past Surgical History:   Procedure Laterality Date   ??? 4 eyelids blepharoplasty  Bilateral 2006   ??? GALLBLADDER SURGERY     ??? OTHER SURGICAL HISTORY         Medication:  Medications that the patient states to be currently taking   Medication Sig   ??? estradiol (VIVELLE-DOT) 0.025 mg/24 hr twice weekly patch Place 1 patch onto the skin two (2) times a week.   ??? levothyroxine 100 mcg tablet Take 1 tablet (100 mcg total) by mouth daily.   ??? progesterone 100 mg capsule Take 100 mg by mouth daily.       Allergies:   Allergies   Allergen Reactions   ??? Prednisone Anaphylaxis and Throat Swelling/Itching/Tightness   ??? Latex Rash       Review of Systems: SEE HPI.      PHYSICAL EXAM:     BP 92/59  ~ Pulse 84  ~ Temp 36.6 ???C (97.9 ???F) (Oral)  ~ Resp 16  ~ Ht 5' 2'' (1.575 m)  ~ Wt 147 lb (66.7 kg)  ~ SpO2 97%  ~ Breastfeeding? No  ~ BMI 26.89 kg/m???   Gen: No acute distress, alert & oriented.  Neck - large goiter, no nodules  Lungs: Clear to auscultation bilaterally, no wheezing, rales, or rhonchi    CV: Regular rate and rhythm,2/6 sem at rusb,no gallops, or rub     Abd: Benign: Soft, Non-tender, Non-distended, normal bowel sounds. No guarding, tenderness, rebound or masses.    Ext: no clubbing, cyanosis or edema  No edema, periph pulses intact,   Neuro: No focal neurologic defects.   Slight slow achilles relaxation phase Feet-  No redness, tender over plantar insertion area    STUDIES:   NA        ASSESSMENT      Brittany Meyer is a 57 y.o. female who presents with      Encounter Diagnoses   Name Primary?   ??? Plantar fasciitis Yes   ??? Abnormal laboratory test    ??? Hypothyroidism due to Hashimoto's thyroiditis    ??? Prediabetes    ??? Hyperlipidemia, unspecified hyperlipidemia type            PLAN:         Orders Placed This Encounter   ??? TSH with reflex FT4, FT3   ??? Sedimentation Rate, Erythrocyte   ??? PEP & Total Protein,Serum   ??? Immunofixation, Serum   ??? Hgb A1c   ??? Lipid Panel   ??? Comprehensive Metabolic Panel   ??? Brittany Meyer  [facility]  Follow-up: The patient was informed that lab results will be sent via email or mail unless they are of an urgent nature.   If symptoms worsen, the patient is to contact us.  If emergent symptoms arise after hours, the patient should go to an emergency room.    The above plan was explained and reviewed extensively with the patient.  All questions were answered.    Letta Kocher MD  [facility]  [facility]

## 2018-01-01 ENCOUNTER — Institutional Professional Consult (permissible substitution): Payer: BLUE CROSS/BLUE SHIELD

## 2018-01-01 DIAGNOSIS — E785 Hyperlipidemia, unspecified: Secondary | ICD-10-CM

## 2018-01-01 DIAGNOSIS — R899 Unspecified abnormal finding in specimens from other organs, systems and tissues: Secondary | ICD-10-CM

## 2018-01-01 DIAGNOSIS — R7303 Prediabetes: Secondary | ICD-10-CM

## 2018-01-01 DIAGNOSIS — E038 Other specified hypothyroidism: Secondary | ICD-10-CM

## 2018-01-01 DIAGNOSIS — E063 Autoimmune thyroiditis: Secondary | ICD-10-CM

## 2018-01-01 LAB — Sedimentation Rate, Erythrocyte: SEDIMENTATION RATE, ERYTHROCYTE: 11 mm/h (ref <=25)

## 2018-01-02 LAB — Lipid Panel: CHOLESTEROL: 252 mg/dL (ref >50–<130)

## 2018-01-02 LAB — TSH with reflex FT4, FT3: TSH: 6 u[IU]/mL — ABNORMAL HIGH (ref 0.3–4.7)

## 2018-01-02 LAB — Comprehensive Metabolic Panel
CALCIUM: 10.1 mg/dL (ref 8.6–10.4)
POTASSIUM: 4.1 mmol/L (ref 3.6–5.3)

## 2018-01-02 LAB — Free T4: FREE T4: 1.4 ng/dL (ref 0.8–1.7)

## 2018-01-02 LAB — Total Protein, Serum (PEP): TOTAL PROTEIN, SERUM: 7.9 g/dL (ref 6.1–8.2)

## 2018-01-02 LAB — Hgb A1c: HGB A1C - HPLC: 5.6 (ref <5.7)

## 2018-01-04 LAB — Antinuclear Ab: ANTINUCLEAR AB: <1:40 {titer}

## 2018-01-05 ENCOUNTER — Telehealth: Payer: BLUE CROSS/BLUE SHIELD

## 2018-01-05 LAB — Immunofixation, Serum

## 2018-01-05 NOTE — Telephone Encounter
Results Request - The patient would like to discuss the results of their recent tests.     1) Ordering MD: Dr. Blanch Media       2) What type of test(s)? Labs     3) When was it performed? 01/01/18    4) Where was it performed? [place]     If [facility], are results available in [facility]? Yes   If outside facility, what is their phone number?     Patient has been notified of the 24-48 hour turnaround time.

## 2018-01-05 NOTE — Telephone Encounter
Labs not complete, still in process, spoke with patient.

## 2018-01-06 LAB — PEP, Serum: GAMMA GLOBULINS: 1.4 g/dL — ABNORMAL HIGH (ref 0.6–1.3)

## 2018-01-06 NOTE — Telephone Encounter
Status on test results. Please call pt to discuss.

## 2018-01-08 NOTE — Telephone Encounter
Patient would like to discuss results. Advised that Dr. Elisabeth Cara is off. Requesting if covering doctor can assist. Please call back, thank you!

## 2018-01-11 ENCOUNTER — Telehealth: Payer: BLUE CROSS/BLUE SHIELD

## 2018-01-11 NOTE — Telephone Encounter
Call Back Request    MD:  Solomon,Norman.   Reason for call back: Pt calling back regarding her lab results.  Requesting to speak with the [facility] today.   Any Symptoms:  []  Yes  [x]  No      ? If yes, what symptoms are you experiencing:  Congestion and bronchial sx's   o Duration of symptoms (how long):  1 wk.   o Have you taken medication for symptoms (OTC or Rx):  Decongestant meds     Patient or caller has been notified of the 24-48 hour turnaround time.

## 2018-01-11 NOTE — Telephone Encounter
Called pt and mailbox is full unable to leave message

## 2018-01-12 ENCOUNTER — Telehealth: Payer: BLUE CROSS/BLUE SHIELD

## 2018-01-12 ENCOUNTER — Ambulatory Visit: Payer: BLUE CROSS/BLUE SHIELD

## 2018-01-12 DIAGNOSIS — E038 Other specified hypothyroidism: Secondary | ICD-10-CM

## 2018-01-12 DIAGNOSIS — E063 Autoimmune thyroiditis: Secondary | ICD-10-CM

## 2018-01-12 DIAGNOSIS — M5412 Radiculopathy, cervical region: Secondary | ICD-10-CM

## 2018-01-12 DIAGNOSIS — E785 Hyperlipidemia, unspecified: Secondary | ICD-10-CM

## 2018-01-12 MED ORDER — LEVOTHYROXINE SODIUM 112 MCG PO TABS
112 ug | ORAL_TABLET | Freq: Every day | ORAL | 3 refills | Status: AC
Start: 2018-01-12 — End: 2018-02-12

## 2018-01-12 NOTE — Telephone Encounter
Spoke to pt    Had elev tsh and ldl

## 2018-01-12 NOTE — Telephone Encounter
Spoke to pt  Changed thyroid dose  checke cac score , low but not zero  Reviewed cardiac risk  rec pt for neck as neuro did a year ago  Reviewed labs

## 2018-01-12 NOTE — Telephone Encounter
Request completed.

## 2018-01-12 NOTE — Telephone Encounter
Message to Practice/Provider    MD: Dr. Elisabeth Cara     Message: Pt forgot her referral and is scheduled for an appointment today 01/12/18. Please fax referral # [phone]. Thank you     Return call is not being requested by the patient or caller.    Patient or caller has been notified of the 24-48 hour processing turnaround time if applicable.

## 2018-01-27 MED ORDER — VALACYCLOVIR HCL 500 MG PO TABS
ORAL_TABLET | 0 refills | Status: AC
Start: 2018-01-27 — End: 2018-03-12

## 2018-02-11 ENCOUNTER — Telehealth: Payer: BLUE CROSS/BLUE SHIELD

## 2018-02-12 ENCOUNTER — Ambulatory Visit: Payer: BLUE CROSS/BLUE SHIELD

## 2018-02-12 ENCOUNTER — Telehealth: Payer: BLUE CROSS/BLUE SHIELD

## 2018-02-12 DIAGNOSIS — Z1283 Encounter for screening for malignant neoplasm of skin: Secondary | ICD-10-CM

## 2018-02-12 MED ORDER — LEVOTHYROXINE SODIUM 112 MCG PO TABS
112 ug | ORAL_TABLET | Freq: Every day | ORAL | 3 refills | Status: AC
Start: 2018-02-12 — End: 2018-09-23

## 2018-02-12 NOTE — Telephone Encounter
Referral Request    1) Patient is requesting a referral to: Dermatology        Specific location? No   Particular MD in mind? No    2) The issue (diagnosis, symptoms): Skin check for moles    3) Has patient been seen by their doctor for this issue? N/A     4) Was an appointment offered? No, Pt states she did not want an appt.     5) Patient's last office visit: 12/31/17    Patient has been notified of the 24-48 hour turnaround time.      ___________________________________________________    Referral Request    1) Patient is requesting a referral to: Gynecology       Specific location? No   Particular MD in mind? No    2) The issue (diagnosis, symptoms): No    3) Has patient been seen by their doctor for this issue? N/A     4) Was an appointment offered? No, Pt states she did not want an appt.       5) Patient's last office visit:  12/31/17    Patient has been notified of the 24-48 hour turnaround time.      _________________________________________________    Call Back Request    MD:  Dr. Elisabeth Cara  Reason for call back: Pt states she missed a call back from Dr. Francine Graven nurse and request a call back.     Any Symptoms:  []  Yes  [x]  No      ? If yes, what symptoms are you experiencing:    o Duration of symptoms (how long):    o Have you taken medication for symptoms (OTC or Rx):      Patient or caller has been notified of the 24-48 hour turnaround time.

## 2018-02-12 NOTE — Telephone Encounter
Good Afternoon,      Refill Request    Rx: Tirosint 100 mcg, #90    Pharmacy requesting refill.      Verified patient was seen within the last 6 months. If not seen within 6 months, offered appointment.     Collected prescription information from patient.     Pended orders for provider to review and sign.     Pharmacy location was confirmed and class was set for pended orders.    Patient has been notified of the 24-48 hour turnaround time.    Thank you

## 2018-02-12 NOTE — Telephone Encounter
Referral was made and pending approval.

## 2018-02-16 DIAGNOSIS — Z01419 Encounter for gynecological examination (general) (routine) without abnormal findings: Secondary | ICD-10-CM

## 2018-02-16 DIAGNOSIS — Z760 Encounter for issue of repeat prescription: Secondary | ICD-10-CM

## 2018-02-16 NOTE — Telephone Encounter
Spoke with patient, states the thyroid med should be Tirosint, previous rx was 100 mcg. States rx sent was for levothyroxine which she picked up already.

## 2018-02-17 MED ORDER — LEVOTHYROXINE SODIUM 112 MCG PO CAPS
112 ug | ORAL_CAPSULE | Freq: Every day | ORAL | 1 refills | Status: AC
Start: 2018-02-17 — End: 2018-11-16

## 2018-02-17 NOTE — Telephone Encounter
Ok per Dr Elisabeth Cara to send rx for Tirosint 112 mcg #90 for patient. Referral entered for [facility] to Dr Maryan Char.  Tala Eber mail sent to patient re additional request for STD check

## 2018-02-17 NOTE — Telephone Encounter
Correction, patient is not on my chart.

## 2018-03-02 ENCOUNTER — Telehealth: Payer: BLUE CROSS/BLUE SHIELD

## 2018-03-02 ENCOUNTER — Ambulatory Visit: Payer: BLUE CROSS/BLUE SHIELD

## 2018-03-02 DIAGNOSIS — K635 Polyp of colon: Secondary | ICD-10-CM

## 2018-03-02 DIAGNOSIS — R300 Dysuria: Secondary | ICD-10-CM

## 2018-03-02 DIAGNOSIS — E038 Other specified hypothyroidism: Secondary | ICD-10-CM

## 2018-03-02 DIAGNOSIS — E785 Hyperlipidemia, unspecified: Secondary | ICD-10-CM

## 2018-03-02 DIAGNOSIS — E063 Autoimmune thyroiditis: Secondary | ICD-10-CM

## 2018-03-02 DIAGNOSIS — Z1283 Encounter for screening for malignant neoplasm of skin: Secondary | ICD-10-CM

## 2018-03-02 NOTE — Progress Notes
Date: 03/10/2018    SUBJECTIVE:     Brittany Meyer is a 57 y.o. female who is here for     Urinary symptoms  Pain with pressure and burning and a smell  And dark yellow urine  No hx of chlamydia, but had some homeopathic tests that suggested it  Has had rare unprotected sex over the last couple of years  No hx vag dc      Past Medical History:   Diagnosis Date   ??? H/O seasonal allergies    ??? Headache    ??? Hypothyroid        Past Surgical History:   Procedure Laterality Date   ??? 4 eyelids blepharoplasty  Bilateral 2006   ??? GALLBLADDER SURGERY     ??? OTHER SURGICAL HISTORY         Medication:  Medications that the patient states to be currently taking   Medication Sig   ??? estradiol (VIVELLE-DOT) 0.025 mg/24 hr twice weekly patch Place 1 patch onto the skin two (2) times a week.   ??? levothyroxine (TIROSINT) 112 mcg capsule Take 1 capsule (112 mcg total) by mouth daily.   ??? progesterone 100 mg capsule Take 100 mg by mouth daily.   ??? VALACYCLOVIR 500 mg tablet TAKE 1 TABLET(500 MG) BY MOUTH TWICE DAILY       Allergies:   Allergies   Allergen Reactions   ??? Prednisone Anaphylaxis and Throat Swelling/Itching/Tightness   ??? Latex Rash       Review of Systems: SEE HPI.      PHYSICAL EXAM:     BP 100/60  ~ Pulse 60  ~ Temp 36.6 ???C (97.8 ???F) (Oral)  ~ Resp 16  ~ Ht 5' 0.5'' (1.537 m)  ~ Wt 141 lb (64 kg)  ~ BMI 27.08 kg/m???   Gen: No acute distress, alert & oriented.      Abd: Benign: Soft, Non-tender, Non-distended, normal bowel sounds. No guarding, tenderness, rebound or masses.    Back no cva tenderness      STUDIES:   NA        ASSESSMENT      Brittany Meyer is a 57 y.o. female who presents with      Encounter Diagnoses   Name Primary?   ??? Dysuria Yes   ??? Skin cancer screening    ??? Polyp of colon, unspecified part of colon, unspecified type    ??? Hypothyroidism due to Hashimoto's thyroiditis    ??? Hyperlipidemia, unspecified hyperlipidemia type            PLAN:         Orders Placed This Encounter ??? Bacterial Culture Urine, Clean Catch   ??? Neisseria gonorrhoeae PCR, Urine   ??? Chlamydia trachomatis PCR, Urine   ??? Urinalysis,Routine   ??? RPR   ??? UA,Dipstick   ??? UA,Microscopic   ??? Taheri,Daniel  [facility]) [place]   ??? Katkov,William  [facility]) [facility]   ??? HBS Antigen   ??? HCV Antibody Screen   ??? HIV-1/2 Ag/Ab 4th Generation with Reflex Confirmation             Follow-up: The patient was informed that lab results will be sent via email or mail unless they are of an urgent nature.   If symptoms worsen, the patient is to contact us.  If emergent symptoms arise after hours, the patient should go to an emergency room.    The above plan was explained and reviewed extensively with  the patient.  All questions were answered.    Rayma Hegg MD  [facility]  [facility]

## 2018-03-03 LAB — Lipid Panel: NON-HDL,CHOLESTEROL,CALC: 235 mg/dL — ABNORMAL HIGH (ref <130)

## 2018-03-03 LAB — Neisseria gonorrhoeae PCR

## 2018-03-03 LAB — TSH: TSH: 16 u[IU]/mL — ABNORMAL HIGH (ref 0.3–4.7)

## 2018-03-03 LAB — Chlamydia trachomatis PCR

## 2018-03-03 LAB — Free T4: FREE T4: 1.4 ng/dL (ref 0.8–1.7)

## 2018-03-03 LAB — HBs Ag: HEPATITIS B SURFACE ANTIGEN: NONREACTIVE

## 2018-03-03 LAB — UA,Dipstick: GLUCOSE: NEGATIVE (ref 5.0–8.0)

## 2018-03-03 LAB — HIV-1/2 Ag/Ab 4th Generation with Reflex Confirmation: HIV-1/2 AG/AB 4TH GENERATION WITH REFLEX CONFIRMATION: NONREACTIVE

## 2018-03-03 LAB — HCV Ab Screen: HCV ANTIBODY SCREEN: NONREACTIVE

## 2018-03-03 LAB — UA,Microscopic: RBCS: 32 {cells}/uL — ABNORMAL HIGH (ref 0–11)

## 2018-03-03 LAB — RPR: RPR: NONREACTIVE

## 2018-03-03 NOTE — Telephone Encounter
Patient was here for appt today. States she is going to Dr Theda Sers for [facility], she has referral for that. Wants to know if she needs an additional referral to have her do an ultrasound to check for fibroids since she is having some discomfort.

## 2018-03-04 LAB — Bacterial Culture Urine
BACTERIAL CULTURE URINE: >100000 — AB
BACTERIAL CULTURE URINE: >100000 — AB

## 2018-03-05 LAB — Bacterial Culture Urine: BACTERIAL CULTURE URINE: >100000 — AB

## 2018-03-06 ENCOUNTER — Ambulatory Visit: Payer: BLUE CROSS/BLUE SHIELD

## 2018-03-06 ENCOUNTER — Telehealth: Payer: BLUE CROSS/BLUE SHIELD

## 2018-03-06 MED ORDER — NITROFURANTOIN MONOHYD MACRO 100 MG PO CAPS
100 mg | ORAL_CAPSULE | Freq: Two times a day (BID) | ORAL | 0 refills | Status: AC
Start: 2018-03-06 — End: ?

## 2018-03-06 MED ORDER — NITROFURANTOIN MONOHYD MACRO 100 MG PO CAPS
100 mg | ORAL_CAPSULE | Freq: Two times a day (BID) | ORAL | 0 refills | Status: AC
Start: 2018-03-06 — End: 2018-03-07

## 2018-03-06 NOTE — Telephone Encounter
Spoke to pt  rx for uti given  Will wait another month on the tsh as dose inc to 125 mcg  Then repeat lipids

## 2018-03-07 NOTE — Telephone Encounter
Dr Theda Sers will decide at her exam what she needse

## 2018-03-08 ENCOUNTER — Telehealth: Payer: BLUE CROSS/BLUE SHIELD

## 2018-03-08 NOTE — Telephone Encounter
Call Back Request    MD:  Dr. Elisabeth Cara    Reason for call back:  Pt stated that she missed a call from the office and would like to have them call her back. Pt stated that Dr. Elisabeth Cara told her that she has a UTI and that its a result in a staff infection. Pt is very concerned and would like a call back to clarify. Pt is worried that she will infect other people. Please call pt back.    Any Symptoms:  []  Yes  [x]  No      ? If yes, what symptoms are you experiencing:    o Duration of symptoms (how long):    o Have you taken medication for symptoms (OTC or Rx):      Patient or caller has been notified of the 24-48 hour turnaround time.

## 2018-03-08 NOTE — Telephone Encounter
Tried calling pt but mailbox is full. Unable to leave message and pt is not signed on in the portal for me to leave a message.

## 2018-03-09 NOTE — Telephone Encounter
Spoke with patient.

## 2018-03-10 ENCOUNTER — Telehealth

## 2018-03-10 NOTE — Telephone Encounter
Call Back Request    MD: Dr Elisabeth Cara     Reason for call back: Jenny Reichmann from [place] called in request. Pt is requesting for Acyclovir. Pt tried filling medication at a different pharmacy but had issues. Jenny Reichmann is not sure what medication and would like to verify it. Jenny Reichmann is also requesting a new prescription. Please call Jenny Reichmann to confirm and discuss.    Any Symptoms:  []  Yes  [x]  No      ? If yes, what symptoms are you experiencing:    o Duration of symptoms (how long):    o Have you taken medication for symptoms (OTC or Rx):      Patient or caller has been notified of the 24-48 hour turnaround time.

## 2018-03-12 ENCOUNTER — Ambulatory Visit: Payer: BLUE CROSS/BLUE SHIELD

## 2018-03-12 DIAGNOSIS — Z760 Encounter for issue of repeat prescription: Secondary | ICD-10-CM

## 2018-03-12 MED ORDER — VALACYCLOVIR HCL 500 MG PO TABS
ORAL_TABLET | 0 refills
Start: 2018-03-12 — End: ?

## 2018-03-12 MED ORDER — VALACYCLOVIR HCL 500 MG PO TABS
ORAL_TABLET | 10 refills | Status: AC
Start: 2018-03-12 — End: 2018-08-31

## 2018-03-12 NOTE — Telephone Encounter
Rx refilled.

## 2018-03-12 NOTE — Telephone Encounter
rx sent to her pharmacy 

## 2018-03-12 NOTE — Telephone Encounter
The patient or caller has been notified that the physician is currently out of the office. Okay to send to covering physician.      Pt called requesting status on refill request for Valacyclovir 500mg . [facility], [phone]. Pt is completely out of medication for a week and a half. Please call pt to confirm and discuss.

## 2018-08-30 MED ORDER — VALACYCLOVIR HCL 500 MG PO TABS
ORAL_TABLET | 10 refills | Status: AC
Start: 2018-08-30 — End: 2019-03-17

## 2018-09-22 ENCOUNTER — Ambulatory Visit: Payer: BLUE CROSS/BLUE SHIELD

## 2018-09-22 DIAGNOSIS — M7989 Other specified soft tissue disorders: Secondary | ICD-10-CM

## 2018-09-22 DIAGNOSIS — R05 Cough: Secondary | ICD-10-CM

## 2018-09-22 DIAGNOSIS — E7801 Familial hypercholesterolemia: Secondary | ICD-10-CM

## 2018-09-22 DIAGNOSIS — Z1239 Encounter for other screening for malignant neoplasm of breast: Secondary | ICD-10-CM

## 2018-09-22 DIAGNOSIS — E063 Autoimmune thyroiditis: Secondary | ICD-10-CM

## 2018-09-22 DIAGNOSIS — E038 Other specified hypothyroidism: Secondary | ICD-10-CM

## 2018-09-22 MED ORDER — ALBUTEROL SULFATE HFA 108 (90 BASE) MCG/ACT IN AERS
1 | RESPIRATORY_TRACT | 2 refills | Status: AC | PRN
Start: 2018-09-22 — End: ?

## 2018-09-22 NOTE — Progress Notes
Date: 09/22/2018    SUBJECTIVE:     Brittany Meyer is a 58 y.o. female who is here for     Here for cough  Going on for 2 months  Loose but not that productive  No hemoptysis  Had fever at one point, slight  Had muscle aches  Had nasal drainage  Has had issue with possible asthma in the past  Has allergies    Has had foot symptoms  Swelling of toes, get bright red and painful  Wanted to be tested for lyme disease  Grew up in [place]    Review of chart  Has fh cad and cholesterol  Never been on cholesterol meds        Past Medical History:   Diagnosis Date   ??? H/O seasonal allergies    ??? Headache    ??? Hypothyroid        Past Surgical History:   Procedure Laterality Date   ??? 4 eyelids blepharoplasty  Bilateral 2006   ??? GALLBLADDER SURGERY     ??? OTHER SURGICAL HISTORY         Medication:  Medications that the patient states to be currently taking   Medication Sig   ??? estradiol (VIVELLE-DOT) 0.025 mg/24 hr twice weekly patch Place 1 patch onto the skin two (2) times a week.   ??? levothyroxine (TIROSINT) 112 mcg capsule Take 1 capsule (112 mcg total) by mouth daily.   ??? progesterone 100 mg capsule Take 100 mg by mouth daily.   ??? valACYclovir 500 mg tablet TAKE 1 TABLET(500 MG) BY MOUTH TWICE DAILY.       Allergies:   Allergies   Allergen Reactions   ??? Prednisone Anaphylaxis and Throat Swelling/Itching/Tightness   ??? Latex Rash       Review of Systems: SEE HPI.      PHYSICAL EXAM:     BP 100/74  ~ Pulse 67  ~ Temp 36.6 ???C (97.8 ???F) (Oral)  ~ Resp 17  ~ Ht 5' 2'' (1.575 m)  ~ Wt 138 lb 8 oz (62.8 kg)  ~ SpO2 95%  ~ BMI 25.33 kg/m???   Gen: No acute distress, alert & oriented.  heent  tms nl  Throat nl  Equiv sinus tenderness  Neck no lan  Lungs: Clear to auscultation bilaterally, no wheezing, rales, or rhonchi    CV: Regular rate and rhythm, no murmurs, gallops, or rub     Ext: no clubbing, cyanosis or edema         STUDIES:   NA        ASSESSMENT      Brittany Meyer is a 58 y.o. female who presents with      Encounter Diagnoses Name Primary?   ??? Cough Yes   ??? Familial hypercholesterolemia    ??? Foot swelling    ??? Hypothyroidism due to Hashimoto's thyroiditis    ??? Breast cancer screening            PLAN:   Will recheck tsh and chol, if elev--- statins  Will chck lyme titre, doubt issue  Albuterol for cough      Orders Placed This Encounter   ??? XR chest pa+lat (2 views)   ??? Mammogram, screening, bilat breast   ??? Lyme Disease EIA/WB   ??? Hgb A1c   ??? Lipid Panel   ??? TSH with reflex FT4, FT3   ??? albuterol 90 mcg/act inhaler  Follow-up: The patient was informed that lab results will be sent via email or mail unless they are of an urgent nature.   If symptoms worsen, the patient is to contact us.  If emergent symptoms arise after hours, the patient should go to an emergency room.    The above plan was explained and reviewed extensively with the patient.  All questions were answered.    Letta Kocher MD  [facility]  [facility]

## 2018-09-23 LAB — Lipid Panel: NON-HDL,CHOLESTEROL,CALC: 189 mg/dL — ABNORMAL HIGH (ref <130)

## 2018-09-23 LAB — TSH with reflex FT4, FT3: TSH: 1.1 u[IU]/mL (ref 0.3–4.7)

## 2018-09-23 LAB — Hgb A1c: HGB A1C - HPLC: 5.7 — ABNORMAL HIGH (ref <5.7)

## 2018-09-25 LAB — Lyme Disease EIA/WB: LYME DISEASE AB,TOTAL: 0.14 LIV (ref 0.00–1.20)

## 2018-10-20 ENCOUNTER — Telehealth: Payer: BLUE CROSS/BLUE SHIELD

## 2018-10-20 NOTE — Telephone Encounter
Results Request - The patient would like to discuss the results of their recent tests.     1) Ordering MD:  Elisabeth Cara    2) What type of test(s)?  Labs    3) When was it performed?  10/14/2018    4) Where was it performed?  [facility]    If [place], are results available in [facility]? Yes   If outside facility, what is their phone number?     Patient has been notified of the 24-48 hour turnaround time.

## 2018-10-21 ENCOUNTER — Ambulatory Visit: Payer: BLUE CROSS/BLUE SHIELD

## 2018-10-21 DIAGNOSIS — M79671 Pain in right foot: Secondary | ICD-10-CM

## 2018-10-21 DIAGNOSIS — M79672 Pain in left foot: Secondary | ICD-10-CM

## 2018-10-21 DIAGNOSIS — Z1211 Encounter for screening for malignant neoplasm of colon: Secondary | ICD-10-CM

## 2018-10-21 DIAGNOSIS — R0789 Other chest pain: Secondary | ICD-10-CM

## 2018-10-21 DIAGNOSIS — E7801 Familial hypercholesterolemia: Secondary | ICD-10-CM

## 2018-10-21 DIAGNOSIS — K625 Hemorrhage of anus and rectum: Secondary | ICD-10-CM

## 2018-10-21 NOTE — Progress Notes
Date: 10/21/2018    SUBJECTIVE:     Brittany Meyer is a 58 y.o. female who is here for     Here for several reasons  Has had cp for several days on the right side, comes and goes  Dull ache  Gets lightheaded when she gets up  No clear sob, no radiation.  No exertional component  Has recent cough and neck and shoulder pain- going on several weeks  Hasn't gone in for chest xray yet    Has had elevated ldl  From 205 to 172 , up and down  f with cad / mi at 50  p gm had stroke at 81    Had issue a week ago  Pt ate nuts, tried to go move her bowel , felt block  Tried to force bm, very painful, but blood noted,  Bleeding stopped, was in the bowel, was brb  No clots.  There was a line of blood on the stool,    Has had both feet with swelling and irritation  Toes were like swelling and painful.  Going on for year, did some treatments which helped, water treatments    Has had recurrent bladder infections over the past year.        Past Medical History:   Diagnosis Date   ??? H/O seasonal allergies    ??? Headache    ??? Hypothyroid        Past Surgical History:   Procedure Laterality Date   ??? 4 eyelids blepharoplasty  Bilateral 2006   ??? GALLBLADDER SURGERY     ??? OTHER SURGICAL HISTORY         Medication:  Medications that the patient states to be currently taking   Medication Sig   ??? albuterol 90 mcg/act inhaler Inhale 1 puff every four (4) hours as needed (shortness of breath or wheezing).   ??? estradiol (VIVELLE-DOT) 0.025 mg/24 hr twice weekly patch Place 1 patch onto the skin two (2) times a week.   ??? progesterone 100 mg capsule Take 100 mg by mouth daily.   ??? valACYclovir 500 mg tablet TAKE 1 TABLET(500 MG) BY MOUTH TWICE DAILY.       Allergies:   Allergies   Allergen Reactions   ??? Prednisone Anaphylaxis and Throat Swelling/Itching/Tightness   ??? Latex Rash       Review of Systems: SEE HPI.      PHYSICAL EXAM:     BP 95/68  ~ Pulse 82  ~ Temp 36.8 ???C (98.3 ???F) (Oral)  ~ Resp 19  ~ Ht 5' 2'' (1.575 m)  ~ Wt 141 lb 6.4 oz (64.1 kg)  ~ BMI 25.86 kg/m???   Gen: No acute distress, alert & oriented.    Lungs: Clear to auscultation bilaterally, no wheezing, rales, or rhonchi    CV: Regular rate and rhythm, no murmurs, gallops, or rub     Abd: Benign: Soft, Non-tender, Non-distended, normal bowel sounds. No guarding, tenderness, rebound or masses.    Ext: no clubbing, cyanosis or edema    Neuro: No focal neurologic defects.     STUDIES:   NA        ASSESSMENT      Brittany Meyer is a 58 y.o. female who presents with      Encounter Diagnoses   Name Primary?   ??? Pain in both feet Yes   ??? Atypical chest pain    ??? BRBPR (bright red blood per rectum)    ??? Colon  cancer screening    ??? Familial hypercholesterolemia            PLAN:         Orders Placed This Encounter   ??? XR foot ap+lat bilat (2 views ea)   ??? CT coronary calcification screening wo contrast   ??? Sedimentation Rate, Erythrocyte   ??? Antinuclear Ab   ??? Rheumatoid Factor   ??? Uric Acid   ??? *[place], [facility]             Follow-up: The patient was informed that lab results will be sent via email or mail unless they are of an urgent nature.   If symptoms worsen, the patient is to contact us.  If emergent symptoms arise after hours, the patient should go to an emergency room.    The above plan was explained and reviewed extensively with the patient.  All questions were answered.    Letta Kocher MD  [facility]  [facility]

## 2018-10-21 NOTE — Telephone Encounter
2018 ACC/AHA guidelines recommends that patient is not in statin benefit group. Encourage adherence to heart-healthy lifestyle.    10-year ASCVD risk  is 1.6%  as of 9:04 PM on 10/20/2018  10-year ASCVD risk with optimal risk factors is 1.3%.  Values used to calculate ASCVD score:  Age: 58 y.o.   Gender: Female Race: Not African American.  HDL cholesterol: 60 mg/dL. HDL cholesterol measured 28 days ago.  Total cholesterol: 249 mg/dL. Total cholesterol measured 28 days ago.  LDL cholesterol: 175 mg/dL. LDL cholesterol measured 28 days ago.  Systolic BP: 517 mm Hg. BP was measured 28 days ago.  The patient is not being treated with a medication that influences SBP.  The patient is currently not a smoker.  The patient does not have a diagnosis of diabetes.    Click here for the [facility] ASCVD Cardiovascular Risk Estimator Plus tool Landscape [employer]).      Spoke tp pt  Will come in tomorrow

## 2018-10-22 ENCOUNTER — Telehealth: Payer: BLUE CROSS/BLUE SHIELD

## 2018-10-22 LAB — Differential Automated: ABSOLUTE EOS COUNT: 0.19 10*3/uL (ref 0.00–0.50)

## 2018-10-22 LAB — Uric Acid: URIC ACID: 5.4 mg/dL (ref 2.9–7.0)

## 2018-10-22 LAB — Sedimentation Rate, Erythrocyte: SEDIMENTATION RATE, ERYTHROCYTE: 9 mm/h (ref <=25)

## 2018-10-22 LAB — Rheumatoid Factor: RHEUMATOID FACTOR: <10 [IU]/mL (ref <25)

## 2018-10-22 LAB — CBC: RED BLOOD CELL COUNT: 4.49 x10E6/uL (ref 3.96–5.09)

## 2018-10-23 NOTE — Telephone Encounter
Call Back Request    MD:  Dr. Elisabeth Cara     Reason for call back: Patient is at [facility] right now and they need copies of her x-ray orders. She was being checked in for a mammogram and was unable to stay on the line for a PDL call. Please forward the x-ray order as soon as possible. Thank you     Any Symptoms:  []  Yes  []  No      ? If yes, what symptoms are you experiencing:    o Duration of symptoms (how long):    o Have you taken medication for symptoms (OTC or Rx):      Patient or caller has been notified of the 24-48 hour turnaround time.

## 2018-10-25 LAB — Antinuclear Ab: ANTINUCLEAR AB: <1:40 {titer}

## 2018-10-26 ENCOUNTER — Telehealth: Payer: BLUE CROSS/BLUE SHIELD

## 2018-10-26 ENCOUNTER — Ambulatory Visit: Payer: BLUE CROSS/BLUE SHIELD

## 2018-10-26 DIAGNOSIS — K6289 Other specified diseases of anus and rectum: Secondary | ICD-10-CM

## 2018-10-26 NOTE — Telephone Encounter
Spoke to pt  Will see if she can get in to colorectal today or tomorrow  Brittany Meyer or Brittany Meyer, if not, she will come at the end of the day

## 2018-10-26 NOTE — Telephone Encounter
Call Back Request    MD:  Dr. Scarlette Ar    Reason for call back: Patient has appt schedule with MD for 11/01/2018. Per patient every time she has a bowel movement she is on pain. Patient would like for MD to advise her on what to do for know until she comes in to her appt.  Please assist  Thank you       Any Symptoms:  [x]  Yes  []  No      ? If yes, what symptoms are you experiencing:  Bowel movent pain  o Duration of symptoms (how long):    o Have you taken medication for symptoms (OTC or Rx):      Patient or caller has been notified of the 24-48 hour turnaround time.

## 2018-10-26 NOTE — Telephone Encounter
Patient informed she is a new patient. Confirmed with Dr. Scarlette Ar none of our doctors can't advise patient until the day of patient scheduled appointment Monday 11/01/2018. I checked  with scheduling for a sooner appointment and there was none. Patient verbalized understanding.

## 2018-11-01 ENCOUNTER — Telehealth: Payer: BLUE CROSS/BLUE SHIELD

## 2018-11-01 ENCOUNTER — Ambulatory Visit: Payer: BLUE CROSS/BLUE SHIELD | Attending: Gastroenterology

## 2018-11-01 ENCOUNTER — Inpatient Hospital Stay: Payer: BLUE CROSS/BLUE SHIELD | Attending: Gastroenterology

## 2018-11-01 DIAGNOSIS — K921 Melena: Secondary | ICD-10-CM

## 2018-11-01 DIAGNOSIS — K5900 Constipation, unspecified: Secondary | ICD-10-CM

## 2018-11-01 DIAGNOSIS — Z86010 Personal history of colonic polyps: Secondary | ICD-10-CM

## 2018-11-01 NOTE — Patient Instructions
Colonoscopy    Start daily Citrucel and Miralax

## 2018-11-01 NOTE — Consults
GASTROENTEROLOGY CONSULTATION      PATIENT: Brittany Meyer  MRN: [id number]  DOB: 10-05-1960  DATE OF SERVICE: 11/01/2018     REFERRING PROVIDER: Gwendlyn Deutscher., MD  PRIMARY CARE PROVIDER: Gwendlyn Deutscher., MD    REASON FOR REFERRAL:   No chief complaint on file.        HISTORY OF PRESENT ILLNESS:  Brittany Meyer is a 58 y.o. female with a history significant for colon polyps and cholecystectomy who presents for evaluation for blood in stool.     About 2 weeks ago, the patient had bright red blood per rectum after straining and a painful bowel movement (the blood was a small amount on the stool). She had eaten a bag of cashews prior. She continued to have painful bowel movements with straining and blood up until today when she had a looser bowel movement with no blood and decreased pain.   She has been taking stool softeners over the past week (Colace, recently smooth-moove tea).   Four days ago she had a rich/sugary meal and had abdominal pain and emesis after that night. This self-resolved, and she has otherwise not had abdominal pain or nausea. She has not had fevers.     She had upper and lower endoscopy in 07/2009 with two sessile polyps removed (hyperplastic), and GE junction biopsy consistent with reflux esophagitis.     She most recently had colonoscopy in 2017 in [place], was told she had a polyp and should be re-screened in 3 years. She reportedly had small tears and thinks but is unsure if they told her she had Crohn's disease.      Her family history includes a cousin with colon cancer at age 46, and a grandmother with colon cancer in her 41s.      PAST MEDICAL HISTORY:  Past Medical History:   Diagnosis Date   ??? H/O seasonal allergies    ??? Headache    ??? Hypothyroid         MEDICATIONS:  Current Outpatient Medications   Medication Sig   ??? albuterol 90 mcg/act inhaler Inhale 1 puff every four (4) hours as needed (shortness of breath or wheezing). ??? estradiol (VIVELLE-DOT) 0.025 mg/24 hr twice weekly patch Place 1 patch onto the skin two (2) times a week.   ??? levothyroxine (TIROSINT) 112 mcg capsule Take 1 capsule (112 mcg total) by mouth daily. (Patient not taking: Reported on 10/21/2018.)   ??? progesterone 100 mg capsule Take 100 mg by mouth daily.   ??? valACYclovir 500 mg tablet TAKE 1 TABLET(500 MG) BY MOUTH TWICE DAILY.     No current facility-administered medications for this visit.         ALLERGIES:  is allergic to prednisone and latex.     PAST SURGICAL HISTORY:  Past Surgical History:   Procedure Laterality Date   ??? 4 eyelids blepharoplasty  Bilateral 2006   ??? GALLBLADDER SURGERY     ??? OTHER SURGICAL HISTORY          FAMILY HISTORY:   family history includes Colon cancer in her paternal grandmother; Heart attack (age of onset: 29) in her maternal grandfather; Rheumatic fever in her father; Skin cancer in her mother; Stroke in her paternal grandmother; Valvular heart disease in her father.     SOCIAL HISTORY:    reports that she has never smoked. She has never used smokeless tobacco. She reports that she does not drink alcohol or use drugs.     REVIEW  OF SYSTEMS:  A complete review of systems was performed. Pertinent positives and negatives are noted in the HPI.  Remainder of 14 systems is negative.     PHYSICAL EXAMINATION:     Vitals: There were no vitals taken for this visit.   Constitutional: Alert; no apparent distress.   Skin: Skin color, texture, and turgor normal. No rashes or lesions.   Throat: Clear oropharynx. No erythema, exudate, or plaques seen.    Mouth: No perioral lesions.  Tongue is normal.  Normal dentition.    Lungs: Clear to auscultation bilaterally.  No wheezes or rhonchi.   Heart: Regular rate and rhythm. No murmur.   Abdomen: Abdomen is soft, endorses diffuse midl tenderness, but no guarding, no rebound, and non-distended.   Musculoskeletal: Unremarkable.   Neurologic: Grossly normal. Psychiatric: Oriented to time, place and person.     LABORATORY:  Reviewed  Lab Results   Component Value Date    WBC 8.11 10/21/2018    HGB 13.5 10/21/2018    HCT 41.8 10/21/2018    MCV 93.1 10/21/2018    PLT 255 10/21/2018     Lab Results   Component Value Date    CREAT 0.78 01/01/2018    BUN 15 01/01/2018    NA 139 01/01/2018    K 4.1 01/01/2018    CL 100 01/01/2018    CO2 27 01/01/2018    ALT 15 01/01/2018    AST 18 01/01/2018    ALKPHOS 84 01/01/2018    BILITOT 0.6 01/01/2018    ALBUMIN 4.8 01/01/2018    ALBUMIN 5.1 01/01/2018        RADIOLOGY:  Reviewed     ENDOSCOPY:  None      IMPRESSION:  Brittany Meyer is a 58 y.o. female who presents for evaluation of constipation, hematochezia, and history of polyps. Her symptoms are likely due to diet-related constipation, with anal fissure or external vs. internal prolapsed hemorrhoid. Rectal cancer is possible. Risks and benefits of the colonoscopy procedure were discussed with the patient and all questions were answered, patient wishes to proceed with endoscopic evaluation.      RECOMMENDATIONS:  - Citrucel daily, stop 1 week prior to colonoscopy  - Miralax daily  - diagnostic and screening colonoscopy with MAC     Christella Hartigan MD  Internal Medicine/Pediatrics, PGY-4  11/01/2018     dwa Dr. Minette Brine.        Colonoscopy- The risks and benefits of colonoscopy with sedation were explained in detail to the patient. The risks include, but are not limited to, reaction to the sedation, bleeding, perforation, the possibility of incomplete exam, or missed lesions.  The patient was given the opportunity to ask questions and all questions were answered. The patient understands the risks and benefits of the procedure and wishes to proceed.    The patient was seen and examined with the resident.  The case was discussed with the resident, and I agree with the findings and plan of care we formulated together as documented in the resident's note.    Ulyess Blossom, M.D.

## 2018-11-01 NOTE — Telephone Encounter
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 Minette Brine     Staff, please check off all the options applicable before routing the encounter:       [x]  Patient has been informed about the procedure date and time.     [x]  Patient has been provided check in instructions, to bring responsible adult to procedure.    [x]  Provided Prep Instructions, based on orders.     [x]  Reviewed if RX prep was prescribed if applicable to Preferred Pharmacy.      [x]  Ensured insurance turnaround STAT, or Routine were flagged accordingly.     []  If  [employer] patient, offered Monday Conscious Sedation, if not willing to pay 200.00 for MAC.     [x]   Any other type of insurance (Except Medical), please inform patient, MAC is usually a non-covered service. $200.00 is the fee for the professional Anesthesia services.  Conscious Sedation is included in their procedure, but not offered at [facility], or [facility].   Only at [employer] locations, such as [address], [place], [place].         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 Patient has been informed about the $200.00 OUT OF POCKET  expense for MAC and patient has agreed to : PAY PRIOR TO CHECK IN WITH HOSPITAL FINANCIAL TEAM    [facility] if the slot is booked prior to scheduling request being fulfilled, please reach out to the patient to coordinate new procedure date and time.       Reminder to all Clinic Staff???If you are coordinating specific procedure time and date with patient and MD, it is your responsibility to confirm these details with the patient. If MPU schedulers are communicating directly with the patient it is their responsibility to confirm the appointment.              MAC SCRIPT  As you may be aware, there are two sedation options that are used for colonoscopy and upper gastrointestinal endoscopy. One form of sedation consists of a combination of medicines (Versed and Fentanyl) administered by a registered nurse (RN) under the supervision of your gastroenterologist.  This form of sedation is included in your gastroenterologist???s fee.   The other form of sedation is ???intravenous sedation??? with the use of Propofol (sedative) administered by a specially trained physician called an anesthesiologist, also known as MAC (Monitored Anesthesia Care). Propofol has a rapid onset and short duration, compared to Versed and Fentanyl, and provides complete sedation, whereas with conscious sedation this is not guaranteed. Propofol also has potent anti-nausea properties, so one tends to awake quickly and without any ill effects.               Effective July 1st, 2017 the [facility] Medical Procedure unit in [facility] and [place] will only perform cases with Monitored Anesthesia Care (Propofol) for endoscopy procedures.   This type of sedation is not always covered by health plans. We will submit an authorization request if necessary for this type of sedation, but approval will be based on the medical guideline for that insurance carrier, and  there are limited medical conditions that would be considered appropriate to cover an anesthesiologist which include ???severe systematic diseases??? such as significant heart and/or lung diseases, sever insulin dependent diabetes, sleep apnea, and morbid obesity. Other  conditions that may require an anesthesiologist include a history of drug or alcohol abuse, chronic use of Valium type medications such as Ativan, Xanax, or Clonopin, or a prior documented history of previous problems with conscious sedations (Versed and Fentanyl) or anesthesia.  Please refer to your health plan medical guideline for specifics on their anesthesia coverage determination.

## 2018-11-02 NOTE — Telephone Encounter
Reply by: Azrael Maddix Nia Delmo Matty  Done  Pt has been scheduled

## 2018-11-16 MED ORDER — TIROSINT 112 MCG PO CAPS
ORAL_CAPSULE | 3 refills | Status: AC
Start: 2018-11-16 — End: 2019-03-04

## 2018-11-24 ENCOUNTER — Telehealth: Payer: BLUE CROSS/BLUE SHIELD

## 2018-11-24 NOTE — Telephone Encounter
Scheduled telephone enc.

## 2018-11-24 NOTE — Telephone Encounter
Can this be converted to a telephone appt for this patient?

## 2018-11-24 NOTE — Telephone Encounter
Call Back Request    MD:  Dr.Solomon    Reason for call back: Patient wants to discuss her bronchial issue still haven't done the x-ray you ordered,patient has questions, patient also wants to discuss the swelling on her feet.    Any Symptoms:  [x]  Yes  []  No      ? If yes, what symptoms are you experiencing:  Swelling on feet and bronchial issue  o Duration of symptoms (how long):   Several months  o Have you taken medication for symptoms (OTC or Rx):   Over the counter medication that MD recommended    Patient or caller has been notified of the 24-48 hour turnaround time.

## 2018-11-25 ENCOUNTER — Telehealth: Payer: BLUE CROSS/BLUE SHIELD

## 2018-11-25 DIAGNOSIS — M79671 Pain in right foot: Secondary | ICD-10-CM

## 2018-11-25 DIAGNOSIS — M79672 Pain in left foot: Secondary | ICD-10-CM

## 2018-11-29 ENCOUNTER — Ambulatory Visit: Payer: BLUE CROSS/BLUE SHIELD

## 2018-11-30 ENCOUNTER — Telehealth: Payer: BLUE CROSS/BLUE SHIELD

## 2019-01-26 ENCOUNTER — Telehealth: Payer: BLUE CROSS/BLUE SHIELD

## 2019-01-26 DIAGNOSIS — Z01818 Encounter for other preprocedural examination: Secondary | ICD-10-CM

## 2019-01-26 NOTE — Telephone Encounter
Please review and sign pended COVID order.

## 2019-03-03 MED ORDER — TIROSINT 112 MCG PO CAPS
ORAL_CAPSULE | 3 refills | Status: AC
Start: 2019-03-03 — End: ?

## 2019-03-04 ENCOUNTER — Telehealth: Payer: BLUE CROSS/BLUE SHIELD

## 2019-03-04 NOTE — Telephone Encounter
Confirmation Documentation   (RESCHEDULED TO [facility]) procedure(s)   TO Date: 03/28/19  Time: 10:30AM  Check-in Time: 9:30AM    Colonoscopy  X   Endoscopy     Bravo    Cath Placement    Sigmoidoscopy     Small Bowel Entero.    Esophageal Manometry    Anorectal Manometry    Biofeedback    pH study    Capsule Endoscopy    Secretin Infusion Test    Sham Feeding Study        Informed pt:  1. Of Arrival time (time varies based on location)?  2. Of location and room number?  3. Make sure there is a confirmed ride for the procedure.  4. Has procedure been authorized? Y  5. Was MAC Script provided?   6. Reviewed Prep instructions with patient? Y/N?  7. Informed patient to call back with any questions and provided number.Y/N?    NOTE: If patients have any questions regarding prescribed medication patient needs to contact their prescribing physician to ensure it is ok to stop medications.      Comments/Notes:        If VM is left and pt calls back, please warm transfer patient to [phone]. This number is for internal use only and is not to be provided to patients.

## 2019-03-08 ENCOUNTER — Telehealth: Payer: BLUE CROSS/BLUE SHIELD

## 2019-03-08 DIAGNOSIS — R062 Wheezing: Secondary | ICD-10-CM

## 2019-03-08 DIAGNOSIS — Q740 Other congenital malformations of upper limb(s), including shoulder girdle: Secondary | ICD-10-CM

## 2019-03-08 NOTE — Progress Notes
Date: 03/08/2019    SUBJECTIVE:     Brittany Meyer is a 58 y.o. female who is being evaluated by telephone    HPI--    Issues with breathing  Started 6-7 years ago  Had mold in her house and had issue with bleach or chemicals  Had neg covid pcr elsewhere  Has inhaler, wheezes more than usual over the last 6 mo.  Noticed increased size and soreness of r clavicle  Had cxr of chest in feb with basilar atelectasis, and nothing is noted re clavicle  No real sob at rest that she's aware of  Is moving now  No h/o childhood asthma      Past Medical History:   Diagnosis Date   ??? H/O seasonal allergies    ??? Headache    ??? Hypothyroid        Past Surgical History:   Procedure Laterality Date   ??? 4 eyelids blepharoplasty  Bilateral 2006   ??? GALLBLADDER SURGERY     ??? OTHER SURGICAL HISTORY         Medication:  Outpatient Medications Prior to Visit   Medication Sig   ??? albuterol 90 mcg/act inhaler Inhale 1 puff every four (4) hours as needed (shortness of breath or wheezing).   ??? estradiol (VIVELLE-DOT) 0.025 mg/24 hr twice weekly patch Place 1 patch onto the skin two (2) times a week.   ??? progesterone 100 mg capsule Take 100 mg by mouth daily.   ??? TIROSINT 112 mcg capsule TAKE 1 CAPSULE BY MOUTH EVERY DAY   ??? valACYclovir 500 mg tablet TAKE 1 TABLET(500 MG) BY MOUTH TWICE DAILY.     No facility-administered medications prior to visit.        Allergies:   Allergies   Allergen Reactions   ??? Prednisone Anaphylaxis and Throat Swelling/Itching/Tightness   ??? Latex Rash       Review of Systems: SEE HPI.        STUDIES:   NA        ASSESSMENT      Brittany Meyer is a 58 y.o. female who presents with      Encounter Diagnoses   Name Primary?   ??? Wheezing Yes   ??? Abnormal prominence of clavicle            PLAN:   Would follow up for eval of clavicle and pul issues  May use her albuteral inh prn till then      No orders of the defined types were placed in this encounter. Follow-up: The patient was informed that lab results will be sent via email or mail unless they are of an urgent nature.   If symptoms worsen, the patient is to contact us.  If emergent symptoms arise after hours, the patient should go to an emergency room.    The above plan was explained and reviewed extensively with the patient.  All questions were answered.    Letta Kocher MD  [facility]  [facility]

## 2019-03-12 ENCOUNTER — Ambulatory Visit: Payer: BLUE CROSS/BLUE SHIELD

## 2019-03-15 ENCOUNTER — Telehealth: Payer: BLUE CROSS/BLUE SHIELD

## 2019-03-15 NOTE — Telephone Encounter
PDL Call to Practice    Reason for Call: Would like to speak to the office regarding procedure  MD: Dr. Scarlette Ar    Appointment Related?  []  Yes  []  No     If yes;  Date:  Time:    Call warm transferred to PDL: [x]  Yes  []  No    Call Received by Practice Representative:Genesis

## 2019-03-15 NOTE — Telephone Encounter
Ins representative called to advise of anesthesia mac denial, fyi call. Advised pt having procedure at another location. Provides fcu phone number since auth was submitted by department. Ins rep said letter and portal will be updated with decision.

## 2019-03-16 MED ORDER — VALACYCLOVIR HCL 500 MG PO TABS
ORAL_TABLET | ORAL | 10 refills | 10.00000 days
Start: 2019-03-16 — End: ?

## 2019-03-17 ENCOUNTER — Telehealth: Payer: BLUE CROSS/BLUE SHIELD

## 2019-03-17 MED ORDER — VALACYCLOVIR HCL 500 MG PO TABS
ORAL_TABLET | ORAL | 10 refills | 10.00000 days | Status: AC
Start: 2019-03-17 — End: ?

## 2019-03-17 NOTE — Telephone Encounter
Unable to reach patient, who has not activated MyChart either. Patient was initially referred by Dr. Scarlette Ar for a colonoscopy with MAC. Patient's MAC was denied, was planning to update the patient on the denial, and the potential for IVCS (IF the GI MD Dr. Kathyrn Lass) is okay with IVCS since he is scheduled to perform the procedure in [facility] on 03/28/2019.

## 2019-03-21 ENCOUNTER — Telehealth: Payer: BLUE CROSS/BLUE SHIELD

## 2019-03-21 NOTE — Telephone Encounter
Confirmation Documentation    (confirmed) procedure(s)   Date: 7/20  Time: 10:30am  Check-in Time: 9:30am    Colonoscopy  X   Endoscopy     Bravo    Cath Placement    Sigmoidoscopy     Small Bowel Entero.    Esophageal Manometry    Anorectal Manometry    Biofeedback    pH study    Capsule Endoscopy    Secretin Infusion Test    Sham Feeding Study        Informed pt:  1. Of Arrival time (time varies based on location)? y  2. Of location and room number?y  3. Make sure there is a confirmed ride for the procedure.y  4. Has procedure been authorized?  5. Was MAC Script provided? y  47. Reviewed Prep instructions with patient? Y/N?y  7. Informed patient to call back with any questions and provided number.Y/N?y    NOTE: If patients have any questions regarding prescribed medication patient needs to contact their prescribing physician to ensure it is ok to stop medications.      Comments/Notes:        If VM is left and pt calls back, please warm transfer patient to [phone]. This number is for internal use only and is not to be provided to patients.

## 2019-03-23 ENCOUNTER — Telehealth: Payer: BLUE CROSS/BLUE SHIELD

## 2019-03-23 NOTE — Telephone Encounter
Spoke with patient to remind and confirm procedure scheduled for Monday, March 28, 2019  Check-in time:8:30am  Procedure time:9:30am  Advised patient to take any blood pressure, heart, anti-seizurals and inhaler medications that morning of the procedure. Patient advised if using an inhaler to bring to the procedure. Advised patient they must have a ride home from the procedure with someone that can accompany them home. Medical procedure staff must be able to confirm ride when they arrive or the procedure will be cancelled.    Advised to call back with any questions or concerns.     Per Dr. Kathyrn Lass offer MAC with fee but ok with IVCS, Pt requested IVCS

## 2019-03-26 ENCOUNTER — Institutional Professional Consult (permissible substitution): Payer: BLUE CROSS/BLUE SHIELD

## 2019-03-26 DIAGNOSIS — Z01818 Encounter for other preprocedural examination: Secondary | ICD-10-CM

## 2019-03-27 LAB — COVID-19 PCR

## 2019-03-28 NOTE — H&P
Indication for Procedure:     Level of sedation intended for procedure: moderate, deep, MAC  [x]  Moderate []  Deep []  MAC []  Anesthesia   Prior sedation problems []  Yes [x]  No  If yes explain:  Teeth: [x]  Intact []  Loose []  Missing []  Dentures []  Bridges  [place] Visualized: [x]  Yes []  Partially []  not at all  Neck ROM: [x]   Normal []  Limited  Past Medical History:  Past Medical History:   Diagnosis Date   ??? H/O seasonal allergies    ??? Headache    ??? Hypothyroid        Past Surgical History:  Past Surgical History:   Procedure Laterality Date   ??? 4 eyelids blepharoplasty  Bilateral 2006   ??? GALLBLADDER SURGERY     ??? OTHER SURGICAL HISTORY          Family History:  Family History   Problem Relation Age of Onset   ??? Skin cancer Mother    ??? Rheumatic fever Father    ??? Valvular heart disease Father         died at age 61   ??? Colon cancer Paternal Grandmother    ??? Stroke Paternal Grandmother    ??? Heart attack Maternal Grandfather 59   ??? Blindness Neg Hx    ??? Glaucoma Neg Hx         Social History:  Social History     Socioeconomic History   ??? Marital status: Single     Spouse name: Not on file   ??? Number of children: Not on file   ??? Years of education: Not on file   ??? Highest education level: Not on file   Occupational History   ??? Not on file   Social Needs   ??? Financial resource strain: Not on file   ??? Food insecurity:     Worry: Not on file     Inability: Not on file   ??? Transportation needs:     Medical: Not on file     Non-medical: Not on file   Tobacco Use   ??? Smoking status: Never Smoker   ??? Smokeless tobacco: Never Used   Substance and Sexual Activity   ??? Alcohol use: No   ??? Drug use: No   ??? Sexual activity: Not on file   Lifestyle   ??? Physical activity:     Days per week: Not on file     Minutes per session: Not on file   ??? Stress: Not on file   Relationships   ??? Social connections:     Talks on phone: Not on file     Gets together: Not on file     Attends religious service: Not on file Active member of club or organization: Not on file     Attends meetings of clubs or organizations: Not on file     Relationship status: Not on file   Other Topics Concern   ??? Not on file   Social History Narrative    Single    Living in [place].     Working here       Allergies:  Allergies   Allergen Reactions   ??? Prednisone Anaphylaxis and Throat Swelling/Itching/Tightness   ??? Latex Rash       Current Medications:  No current facility-administered medications for this encounter.      ROS:  Constitutional: The patient denies any fever or chills.  HEENT: no symptoms  Skin: Patient denies bruising, rashes  or itching.  Cardiovascular: Patient denies any chest pains.  There is no palpitations.  Respiratory: Patient denies any shortness of breath.  There is no cough or congestion.    Gastrointestinal: as in HPI and chief complaints  Genitourinary: Patient denies any symptoms of frequency urination or burning urination.  Neurological: Patient denies any lightheadedness, dizziness or loss of consciousness.  Psychiatric: Patient denies any symptoms of anxiety, depression or agitation.  Musculoskeletal: Patient denies any ankle swelling.      Physical Exam:    Vitals Signs: BP 109/69  ~ Pulse 89  ~ Temp 36.7 ???C (98 ???F) (Forehead)  ~ Resp 20  ~ Ht 5' 1'' (1.549 m)  ~ Wt 140 lb (63.5 kg)  ~ SpO2 98%  ~ BMI 26.45 kg/m???   Constitutional: Patient appears very comfortable.  HENT: Normal exam  HEENT: normal exam  Cardiovascular: Heart sounds are normal.  There no murmurs or gallops.  There is no irregular heartbeat.  Respiratory: Patient is not short of breath.  There is no wheezing or rales.  Lungs are clear to auscultation.  Gastrointestinal: Abdomen is soft and nontender.  Bowel sounds are present.   No masses noted.   Genitourinary: Unremarkable examination.  Musculoskeletal: There is no edema, cyanosis or clubbing.    Integumentry: There is no bruising.  No rashes are noted. Neurological: Patient is awake alert and fully oriented. Psychiatric: Patient is not depressed, apprehensive or agitated.      Assessment:  Brittany Meyer is a 58 y.o.female who is brought to the endoscopy suite for the procedure because of the following indications: rectal bleed, history of colon polyps, family history of colon cancer        The procedure and its possible complications including but not limited to bleeding and perforation and their treatment including but not limited to blood transfusion and even surgery was once again explained to, and understood by the patient. Patient agrees to proceed with the procedure.    ASA Classification:      I have reviewed the history and physical and have determined Brittany Meyer to be an appropriate candidate and medically stable to undergo the procedure with planned sedation and analgesia.    Patient cleared for discharge once discharge criteria is met.

## 2019-03-28 NOTE — Progress Notes
Patient is doing well post procedure, alert and oriented. Tolerating p.o. intake well. No nausea and vomiting. Abdomen soft and non distended, no complaints. Dr.Albertson spoke with patient  regarding procedure results and follow up care. Verbalized understanding of discharge instructions as given by registered nurse. All patient inquiries answered. Discharged on a stable condition with no complaints, ambulatory, gait steady accompanied by a responsible adult

## 2019-03-28 NOTE — Procedures
PATIENT NAME:       Brittany Meyer, Brittany Meyer  DATE OF BIRTH:       1961-04-30  RECORD NUMBER:      [id number]  DATE/TIME OF PROCEDURE:     03/28/2019 / 09:30:00 AM  ENDOSCOPIST:       Nile Riggs, MD  REFERRING PHYSICIAN:        FELLOW:           INDICATIONS FOR EXAMINATION:      History of colon polyps, rectal bleeding, family history of colon cancer               PROCEDURE PERFORMED:             colonoscopy and cold snare plolypectomy  colonoscopy and biopsy    MEDICATIONS:    Fentany; 100 mcg  Versed 4 mg    PROCEDURE TECHNIQUE:  Patient's medications, allergies, past medical, surgical, social and family histories were reviewed and updated as appropriate. A discussion of informed consent was had with the patient and/or the patient's family prior to the procedure, including   sedation.  The alternatives, benefits and risks of  the procedure including but not limited to perforation, hemorrhage, infection, adverse drug reaction and aspiration were discussed    Description of Procedure:  After discussion of informed consent, and appropriate level of sedation were attained, the patient was placed in the left lateral position.  The patient was monitored continuously with pulse oximetry, blood pressure monitoring, and direct observations.      After digital rectal examination, the colonoscope [id number] #[id number] (Adult) was inserted into the rectum and advanced under direct vision to the level of the cecum.  The quality of the colonic preparation was Excellent.  A careful inspection was made   as the colonoscope was withdrawn.  Findings and interventions are described below.    EXTENT OF EXAM:  cecum  SEDATION START:  09:15:00 AM SEDATION STOP: 09:41:00 AM TOTAL SEDATION TIME: 0hr 26 min    INSTRUMENTS:   [id number] #[id number] (Adult)  TECHNICAL DIFFICULTY:  No   LIMITATIONS:      None  TOLERANCE:   Good  VISUALIZATION:  Good    FINDINGS:   Small bowel bulge at the ileocecal valve Sessile polyp 6 mm Sigmoid removed via cold snare  Sessile polyp 3 mm sigmoid removed via jumbo forceps  Otherwise normal exam to the cecum plus retroflexion    ESTIMATED BLOOD LOSS:   None     DIAGNOSIS:  Small bowel bulge at the ileocecal valve  Sessile polyp 6 mm Sigmoid removed via cold snare  Sessile polyp 3 mm sigmoid removed via jumbo forceps  Otherwise normal exam to the cecum plus retroflexion    RECOMMENDATIONS:  Await biopsies. Re exam 5 years. Office follow ujp          This electronic signature authenticates all electronic and/or handwritten documentation, including orders, generated by the signer during the episode of care contained in this record.  03/28/2019 09:45:49 AM By Nile Riggs MD

## 2019-03-29 ENCOUNTER — Telehealth: Payer: BLUE CROSS/BLUE SHIELD

## 2019-03-29 LAB — Tissue Exam

## 2019-03-29 NOTE — Telephone Encounter
Orders Request    What is being requested? (Tests, Labs, Imaging, etc.): Xray     Reason for the request:  (advised by MD during last appt, per pt)    Where does the patient want to be seen?  [place] TO     If outside [address], what is the fax number to the facility?      Has the patient seen their doctor for this matter? Yes    Last office visit: 03/28/19    Patient was offered an appointment but declined.    Patient has been notified of the 24-48 hour turnaround time.

## 2019-03-30 ENCOUNTER — Ambulatory Visit: Payer: BLUE CROSS/BLUE SHIELD

## 2019-03-30 ENCOUNTER — Ambulatory Visit: Payer: BLUE CROSS/BLUE SHIELD | Attending: Gastroenterology

## 2019-03-30 ENCOUNTER — Telehealth: Payer: BLUE CROSS/BLUE SHIELD

## 2019-03-30 DIAGNOSIS — R1032 Left lower quadrant pain: Secondary | ICD-10-CM

## 2019-03-30 NOTE — Telephone Encounter
Call Back Request    MD:  Dr. Elisabeth Cara    Reason for call back: patient would like to know if she needs to go in fasting for the labs he ordered for vitamin D, Magnesium and for the metabolic panel? Also she is requesting if she can also have the antibody testing ordered as well. She mentioned she is moving out of [place] this Monday coming up and would like to have everything done before moving. Please call patient to advise.     Thank you,   Any Symptoms:  []  Yes  [x]  No      ? If yes, what symptoms are you experiencing:    o Duration of symptoms (how long):    o Have you taken medication for symptoms (OTC or Rx):      Patient or caller has been notified of the 24-48 hour turnaround time.

## 2019-03-30 NOTE — Telephone Encounter
Message to Practice/Provider    MD: Dr. Kathyrn Lass    Message: Pt calling to follow up on x-ray order since she would like to have imaging done today in order to come in to see doctor today in same day. Patient stated she is in a hurry to have all this done so she may be able to book her plane ticket since she is moving out of state. Advised patient of turnaround time.     Return call is not being requested by the patient or caller.    Patient or caller has been notified of the 24-48 hour processing turnaround time if applicable.

## 2019-03-30 NOTE — Telephone Encounter
Order is in.

## 2019-03-30 NOTE — Telephone Encounter
Processed referral (pending) called patient provided number for rad central scheduling and schedule OV for Friday

## 2019-03-31 ENCOUNTER — Ambulatory Visit: Payer: BLUE CROSS/BLUE SHIELD

## 2019-03-31 ENCOUNTER — Ambulatory Visit: Payer: BLUE CROSS/BLUE SHIELD | Attending: Gastroenterology

## 2019-03-31 ENCOUNTER — Telehealth: Payer: BLUE CROSS/BLUE SHIELD

## 2019-03-31 DIAGNOSIS — R6889 Other general symptoms and signs: Secondary | ICD-10-CM

## 2019-03-31 DIAGNOSIS — M25511 Pain in right shoulder: Secondary | ICD-10-CM

## 2019-03-31 DIAGNOSIS — R0602 Shortness of breath: Secondary | ICD-10-CM

## 2019-03-31 DIAGNOSIS — R202 Paresthesia of skin: Secondary | ICD-10-CM

## 2019-03-31 NOTE — Telephone Encounter
03/31/2019- Called the patient to confirm  the reason for her appointment so that we could update it. IDG

## 2019-03-31 NOTE — Progress Notes
Date: 04/03/2019    SUBJECTIVE:     Brittany Meyer is a 58 y.o. female who is here for     Here for continued sob  Has had for x 1 year  No coughing but wheezes  No childhood asthma  Had chem sens 7  Years ago    Past Medical History:   Diagnosis Date   ??? H/O seasonal allergies    ??? Headache    ??? Hypothyroid        Past Surgical History:   Procedure Laterality Date   ??? 4 eyelids blepharoplasty  Bilateral 2006   ??? GALLBLADDER SURGERY     ??? OTHER SURGICAL HISTORY         Medication:  Medications that the patient states to be currently taking   Medication Sig   ??? albuterol 90 mcg/act inhaler Inhale 1 puff every four (4) hours as needed (shortness of breath or wheezing).   ??? estradiol (VIVELLE-DOT) 0.1 mg/24 hr twice weekly patch    ??? progesterone 100 mg capsule Take 100 mg by mouth daily.   ??? TIROSINT 112 mcg capsule TAKE 1 CAPSULE BY MOUTH EVERY DAY   ??? valACYclovir 500 mg tablet TAKE 1 TABLET BY MOUTH TWICE DAILY AS NEEDED.     Current Facility-Administered Medications for the 03/31/19 encounter (Office Visit) with Gwendlyn Deutscher., MD   Medication   ??? fentaNYL (PF) 100 mcg/2 mL inj 100 mcg   ??? midazolam 1 mg/mL inj 4 mg       Allergies:   Allergies   Allergen Reactions   ??? Prednisone Anaphylaxis and Throat Swelling/Itching/Tightness   ??? Latex Rash       Review of Systems: SEE HPI.      PHYSICAL EXAM:     BP 110/75  ~ Pulse 82  ~ Temp 36.5 ???C (97.7 ???F) (Forehead)  ~ Resp 16  ~ Wt 133 lb (60.3 kg)  ~ BMI 25.13 kg/m???   Gen: No acute distress, alert & oriented.    Lungs: Clear to auscultation bilaterally, no wheezing, rales, or rhonchi    CV: Regular rate and rhythm, no murmurs, gallops, or rub     Abd: Benign: Soft, Non-tender, Non-distended, normal bowel sounds. No guarding, tenderness, rebound or masses.    Ext: no clubbing, cyanosis or edema    Neuro: No focal neurologic defects.     STUDIES:   NA        ASSESSMENT      Brittany Meyer is a 58 y.o. female who presents with      Encounter Diagnoses   Name Primary? ??? Suspected Covid-19 Virus Infection Yes   ??? Pain of right sternoclavicular joint    ??? SOB (shortness of breath)    ??? Paresthesia            PLAN:         Orders Placed This Encounter   ??? CT sternoclavicular joints wo contrast   ??? COVID-19 Antibody Test (IgG)   ??? Vitamin B12   ??? PEP & Total Protein,Serum   ??? Hgb A1c   ??? TSH with reflex FT4, FT3   ??? Total Protein,Serum   ??? PEP,Serum   ??? [facility] Referral to Pulmonary/Sleep             Follow-up: The patient was informed that lab results will be sent via email or mail unless they are of an urgent nature.   If symptoms worsen, the patient is to contact us.  If emergent symptoms arise after hours, the patient should go to an emergency room.    The above plan was explained and reviewed extensively with the patient.  All questions were answered.    Letta Kocher MD  [facility]  [facility]

## 2019-04-01 ENCOUNTER — Ambulatory Visit: Payer: BLUE CROSS/BLUE SHIELD

## 2019-04-01 ENCOUNTER — Ambulatory Visit: Payer: BLUE CROSS/BLUE SHIELD | Attending: Gastroenterology

## 2019-04-01 ENCOUNTER — Inpatient Hospital Stay: Payer: BLUE CROSS/BLUE SHIELD | Attending: Gastroenterology

## 2019-04-01 DIAGNOSIS — R1032 Left lower quadrant pain: Secondary | ICD-10-CM

## 2019-04-01 DIAGNOSIS — R062 Wheezing: Secondary | ICD-10-CM

## 2019-04-01 LAB — Hgb A1c: HGB A1C - HPLC: 5.4 (ref <5.7)

## 2019-04-01 LAB — COVID-19 Antibody IgG: COVID-19 IGG QL: NEGATIVE

## 2019-04-01 LAB — TSH with reflex FT4, FT3: TSH: 3.9 u[IU]/mL (ref 0.3–4.7)

## 2019-04-01 LAB — Total Protein, Serum (PEP): TOTAL PROTEIN, SERUM: 7.5 g/dL (ref 6.1–8.2)

## 2019-04-01 LAB — Vitamin B12: VITAMIN B12: 409 pg/mL (ref 254–1060)

## 2019-04-03 ENCOUNTER — Ambulatory Visit: Payer: BLUE CROSS/BLUE SHIELD

## 2019-04-03 ENCOUNTER — Inpatient Hospital Stay: Payer: BLUE CROSS/BLUE SHIELD

## 2019-04-04 DIAGNOSIS — J4520 Mild intermittent asthma, uncomplicated: Secondary | ICD-10-CM | POA: Insufficient documentation

## 2019-04-05 LAB — PEP, Serum: ALBUMIN %: 63.8 (ref 59.3–70.5)

## 2019-04-07 ENCOUNTER — Institutional Professional Consult (permissible substitution): Payer: BLUE CROSS/BLUE SHIELD

## 2019-04-07 ENCOUNTER — Inpatient Hospital Stay: Payer: BLUE CROSS/BLUE SHIELD

## 2019-04-07 DIAGNOSIS — M79671 Pain in right foot: Secondary | ICD-10-CM

## 2019-04-07 DIAGNOSIS — M25511 Pain in right shoulder: Secondary | ICD-10-CM

## 2019-04-07 DIAGNOSIS — Z719 Counseling, unspecified: Secondary | ICD-10-CM

## 2019-04-07 DIAGNOSIS — M79672 Pain in left foot: Secondary | ICD-10-CM

## 2019-04-08 LAB — Magnesium: MAGNESIUM: 1.8 meq/L (ref 1.4–1.9)

## 2019-04-08 LAB — Vitamin D,25-Hydroxy: VITAMIN D,25-HYDROXY: 19 ng/mL — ABNORMAL LOW (ref 20–50)

## 2019-04-08 LAB — Comprehensive Metabolic Panel
CALCIUM: 9.8 mg/dL (ref 8.6–10.4)
UREA NITROGEN: 12 mg/dL (ref 7–22)

## 2019-04-15 ENCOUNTER — Other Ambulatory Visit: Payer: Self-pay

## 2019-04-15 DIAGNOSIS — Z20822 Contact with and (suspected) exposure to covid-19: Secondary | ICD-10-CM

## 2019-04-16 LAB — NOVEL CORONAVIRUS, NAA: SARS-CoV-2, NAA: NOT DETECTED

## 2019-04-24 ENCOUNTER — Ambulatory Visit: Payer: BLUE CROSS/BLUE SHIELD

## 2019-04-25 ENCOUNTER — Ambulatory Visit: Payer: BLUE CROSS/BLUE SHIELD

## 2019-04-25 DIAGNOSIS — R062 Wheezing: Secondary | ICD-10-CM

## 2019-04-28 ENCOUNTER — Ambulatory Visit: Payer: BLUE CROSS/BLUE SHIELD

## 2019-04-28 NOTE — Telephone Encounter
Spoke to pt re her foot pain  Had had prior trauma to her ankle and foot remotely  Her complaint is that her feet get swollen and are so painful that she doesn't think she can work  She has had back issues, that were eval in the remote past by ortho  She had no joint swelling elsewhere  Felt her symptoms started after her gb surgery 4 years ago  When she gets into the tub, her toes occasionally go into spasm where she has to move them mannualy      Left and right foot xrays were reviewed with her  No obvious cause of pain was found

## 2019-05-05 ENCOUNTER — Other Ambulatory Visit: Payer: Self-pay | Admitting: Surgery

## 2019-05-05 DIAGNOSIS — E038 Other specified hypothyroidism: Secondary | ICD-10-CM

## 2019-05-09 ENCOUNTER — Ambulatory Visit
Admission: RE | Admit: 2019-05-09 | Discharge: 2019-05-09 | Disposition: A | Discharge status: Home / Self Care | Payer: Self-pay | Source: Ambulatory Visit | Attending: Surgery | Admitting: Surgery

## 2019-05-09 DIAGNOSIS — E063 Autoimmune thyroiditis: Secondary | ICD-10-CM

## 2019-05-09 DIAGNOSIS — E038 Other specified hypothyroidism: Secondary | ICD-10-CM

## 2019-06-15 ENCOUNTER — Other Ambulatory Visit: Payer: Self-pay

## 2019-06-15 DIAGNOSIS — Z20822 Contact with and (suspected) exposure to covid-19: Secondary | ICD-10-CM

## 2019-06-17 LAB — NOVEL CORONAVIRUS, NAA: SARS-CoV-2, NAA: NOT DETECTED

## 2019-08-15 ENCOUNTER — Other Ambulatory Visit: Payer: Self-pay

## 2019-08-15 DIAGNOSIS — Z20822 Contact with and (suspected) exposure to covid-19: Secondary | ICD-10-CM

## 2019-08-18 LAB — NOVEL CORONAVIRUS, NAA: SARS-CoV-2, NAA: NOT DETECTED

## 2019-12-15 ENCOUNTER — Ambulatory Visit: Payer: BLUE CROSS/BLUE SHIELD

## 2019-12-15 DIAGNOSIS — Z23 Encounter for immunization: Secondary | ICD-10-CM

## 2020-01-24 ENCOUNTER — Encounter: Payer: Self-pay | Admitting: Obstetrics and Gynecology

## 2020-01-24 ENCOUNTER — Other Ambulatory Visit: Payer: Self-pay

## 2020-01-25 ENCOUNTER — Other Ambulatory Visit (HOSPITAL_COMMUNITY)
Admission: RE | Admit: 2020-01-25 | Discharge: 2020-01-25 | Disposition: A | Discharge status: Home / Self Care | Payer: BC Managed Care – PPO | Source: Ambulatory Visit | Attending: Obstetrics and Gynecology | Admitting: Obstetrics and Gynecology

## 2020-01-25 ENCOUNTER — Encounter: Payer: Self-pay | Admitting: Obstetrics and Gynecology

## 2020-01-25 ENCOUNTER — Ambulatory Visit: Payer: BC Managed Care – PPO | Admitting: Obstetrics and Gynecology

## 2020-01-25 VITALS — BP 84/54 | HR 66 | Temp 98.1°F | Ht 60.25 in | Wt 151.0 lb

## 2020-01-25 DIAGNOSIS — Z79890 Hormone replacement therapy (postmenopausal): Secondary | ICD-10-CM | POA: Diagnosis not present

## 2020-01-25 DIAGNOSIS — R109 Unspecified abdominal pain: Secondary | ICD-10-CM

## 2020-01-25 DIAGNOSIS — N930 Postcoital and contact bleeding: Secondary | ICD-10-CM | POA: Insufficient documentation

## 2020-01-25 DIAGNOSIS — R102 Pelvic and perineal pain: Secondary | ICD-10-CM

## 2020-01-25 DIAGNOSIS — N950 Postmenopausal bleeding: Secondary | ICD-10-CM | POA: Diagnosis not present

## 2020-01-25 DIAGNOSIS — R3911 Hesitancy of micturition: Secondary | ICD-10-CM

## 2020-01-25 DIAGNOSIS — N882 Stricture and stenosis of cervix uteri: Secondary | ICD-10-CM

## 2020-01-25 DIAGNOSIS — R140 Abdominal distension (gaseous): Secondary | ICD-10-CM | POA: Diagnosis not present

## 2020-01-25 DIAGNOSIS — N898 Other specified noninflammatory disorders of vagina: Secondary | ICD-10-CM

## 2020-01-25 NOTE — Patient Instructions (Signed)

## 2020-01-25 NOTE — Progress Notes (Signed)
59 y.o. G0P0000 Single White or Caucasian Not Hispanic or Latino female here for trouble urinating.  She states that when she urinates at the end "it's like there is no pressure and something is blocking it" Lower abdominal pain mostly on her left side." She is also experiencing lower back pain.   Her urinary stream is slower than normal, feels like there is a blockage, hard to empty, doesn't always feel empty. Sometimes changing her angle it helps empty her.   She saw a Dealer and had a negative evaluation.   She has felt so distended and bloated. She has been gaining weight. Intermittent sharp pain in BLQ L>R, has happened 4-5 x in the last 3-4 months. The pain lasts for a minute, up to a 7/10 in severity. BM are 2-3 x a day, slightly loose. Has been like this since her gallbladder surgery.   She has been spotting for 4-5 days, hasn't had a cycle for over 5 years. She has been on HRT, was on pellets. She is currently on the 0.025 mg patch and prometrium 100 mg. On the pellets her estrogen level was "too high". She bleed ~5 months ago and was increased to prometrium 200 mg. She was having horrible vasomotor symptoms. Doing fine on the current level.  She feels vulvar swelling. Some irritation, some discharge. Occasional itching. Gets sore with intercourse. Not dry    Patient's last menstrual period was 12/21/2012.          Sexually active: Yes.   No pain The current method of family planning is post menopausal status.    Exercising: No.  The patient does not participate in regular exercise at present. Smoker:  no  Health Maintenance: Pap:  Over one year ago.  History of abnormal Pap:  Yes  MMG:  2020 BMD:   Never  Colonoscopy: July 2020 Normal  TDaP:  Unsure  Gardasil: NA   reports that she has never smoked. She has never used smokeless tobacco. She reports current alcohol use. She reports that she does not use drugs. 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 Plans to retire.   Past Medical History:  Diagnosis Date  . Abdominal pain   . Arthritis   . Headache(784.0)   . Heart murmur   . Hyperlipidemia   . Lymph node(s) enlarged   . Nasal congestion   . Thyroid disease     Past Surgical History:  Procedure Laterality Date  . CHOLECYSTECTOMY    . glandular staff infection  1970    Current Outpatient Medications  Medication Sig Dispense Refill  . albuterol (PROVENTIL HFA;VENTOLIN HFA) 108 (90 Base) MCG/ACT inhaler Inhale 1-2 puffs into the lungs every 6 (six) hours as needed for wheezing or shortness of breath. 1 Inhaler 0  . estradiol (VIVELLE-DOT) 0.025 MG/24HR Place onto the skin.    . Levothyroxine Sodium (TIROSINT) 100 MCG CAPS Take by mouth daily before breakfast.    . naproxen (NAPROSYN) 500 MG tablet Take 1 tablet (500 mg total) by mouth 2 (two) times daily with a meal. 14 tablet 0  . progesterone (PROMETRIUM) 200 MG capsule SMARTSIG:1 Capsule(s) By Mouth Every Night    . valACYclovir (VALTREX) 500 MG tablet      No current facility-administered medications for this visit.    Family History  Problem Relation Age of Onset  . Heart disease Father   . Cancer Maternal Grandmother        breast  .  Cancer Paternal Grandmother        colon    Review of Systems  Constitutional: Positive for unexpected weight change.  Genitourinary: Positive for difficulty urinating.  Musculoskeletal: Positive for back pain.  All other systems reviewed and are negative.   Exam:   BP (!) 84/54   Pulse 66   Temp 98.1 F (36.7 C)   Ht 5' 0.25" (1.53 m)   Wt 151 lb (68.5 kg)   LMP 12/21/2012   SpO2 99%   BMI 29.25 kg/m   Weight change: @WEIGHTCHANGE @ Height:   Height: 5' 0.25" (153 cm)  Ht Readings from Last 3 Encounters:  01/25/20 5' 0.25" (1.53 m)  09/13/12 5\' 2"  (1.575 m)  07/16/11 5\' 2"  (1.575 m)    General appearance: alert, cooperative and appears stated age Head: Normocephalic, without obvious abnormality,  atraumatic Neck: no adenopathy, supple, symmetrical, trachea midline and thyroid normal to inspection and palpation Abdomen: soft, non-tender; mildly distended,  no masses,  no organomegaly Extremities: extremities normal, atraumatic, no cyanosis or edema Skin: Skin color, texture, turgor normal. No rashes or lesions Lymph nodes: Cervical, supraclavicular, and axillary nodes normal. No abnormal inguinal nodes palpated Neurologic: Grossly normal   Pelvic: External genitalia:  no lesions              Urethra:  normal appearing urethra with no masses, tenderness or lesions              Bartholins and Skenes: normal                 Vagina: normal appearing vagina with normal color and discharge, no lesions              Cervix: no lesions and stenotic               Bimanual Exam:  Uterus:  no masses or tenderness, anteverted              Adnexa: no mass, fullness, tenderness               Rectovaginal: Confirms               Anus:  normal sphincter tone, no lesions  The risks of endometrial biopsy were reviewed and a consent was obtained.  A speculum was placed in the vagina and the cervix was cleansed with betadine. A tenaculum was placed on the cervix and the cervix was dilated with the mini-dilators (difficult dilation). The mini pipelle was placed into the endometrial cavity. The uterus sounded to ~7 cm. The endometrial biopsy was performed, taking care to get a representative sample, sampling 360 degrees of the uterine cavity. Minimal tissue was obtained. The tenaculum and speculum were removed. There were no complications.    Gae Dry chaperoned for the exam.  A:  PMP bleeding  Stenotic cervix  On HRT  Abdominal bloating and distention  Abdominal/pelvic pain  Difficulty voiding, negative evaluation with Urology  P:   Endometrial biopsy done, needed cervical dilation  Return for gyn ultrasound  Get a copy of Urology evaluation  Will schedule annual after eval

## 2020-01-27 LAB — SURGICAL PATHOLOGY

## 2020-01-28 LAB — NUSWAB VAGINITIS (VG)
Candida albicans, NAA: NEGATIVE
Candida glabrata, NAA: NEGATIVE
Trich vag by NAA: NEGATIVE

## 2020-01-30 ENCOUNTER — Other Ambulatory Visit: Payer: Self-pay | Admitting: *Deleted

## 2020-01-30 DIAGNOSIS — N950 Postmenopausal bleeding: Secondary | ICD-10-CM

## 2020-01-30 DIAGNOSIS — R3911 Hesitancy of micturition: Secondary | ICD-10-CM

## 2020-01-30 DIAGNOSIS — R102 Pelvic and perineal pain: Secondary | ICD-10-CM

## 2020-01-30 DIAGNOSIS — R140 Abdominal distension (gaseous): Secondary | ICD-10-CM

## 2020-02-03 ENCOUNTER — Other Ambulatory Visit: Payer: Self-pay

## 2020-02-07 ENCOUNTER — Other Ambulatory Visit: Payer: Self-pay

## 2020-02-07 ENCOUNTER — Encounter: Payer: Self-pay | Admitting: Obstetrics and Gynecology

## 2020-02-07 ENCOUNTER — Ambulatory Visit (INDEPENDENT_AMBULATORY_CARE_PROVIDER_SITE_OTHER): Payer: BC Managed Care – PPO | Admitting: Obstetrics and Gynecology

## 2020-02-07 ENCOUNTER — Ambulatory Visit (INDEPENDENT_AMBULATORY_CARE_PROVIDER_SITE_OTHER): Payer: BC Managed Care – PPO

## 2020-02-07 VITALS — BP 100/60 | HR 82 | Temp 98.4°F | Ht 61.0 in | Wt 151.8 lb

## 2020-02-07 DIAGNOSIS — R109 Unspecified abdominal pain: Secondary | ICD-10-CM

## 2020-02-07 DIAGNOSIS — R195 Other fecal abnormalities: Secondary | ICD-10-CM | POA: Diagnosis not present

## 2020-02-07 DIAGNOSIS — N950 Postmenopausal bleeding: Secondary | ICD-10-CM

## 2020-02-07 DIAGNOSIS — R1030 Lower abdominal pain, unspecified: Secondary | ICD-10-CM | POA: Diagnosis not present

## 2020-02-07 DIAGNOSIS — Z79890 Hormone replacement therapy (postmenopausal): Secondary | ICD-10-CM | POA: Diagnosis not present

## 2020-02-07 DIAGNOSIS — R140 Abdominal distension (gaseous): Secondary | ICD-10-CM | POA: Diagnosis not present

## 2020-02-07 DIAGNOSIS — R102 Pelvic and perineal pain: Secondary | ICD-10-CM

## 2020-02-07 DIAGNOSIS — R3911 Hesitancy of micturition: Secondary | ICD-10-CM | POA: Diagnosis not present

## 2020-02-07 NOTE — Progress Notes (Signed)
GYNECOLOGY  VISIT   HPI: 59 y.o.   Single White or Caucasian Not Hispanic or Latino  female   G0P0000 with Patient's last menstrual period was 12/21/2012.   here for ultrasound consult.  The patient was seen last month with c/o PMP spotting. She is on low dose HRT, previously on pellets and was told her estrogen level was "too high".  Biopsy on 01/25/20 with atrophic endometrium  Her pain in in either lower abdominal quadrant laterally. In the last week it has been more in the right. She has 2-3 loose BM's a day. Colonoscopy in ~6/20, normal.    GYNECOLOGIC HISTORY: Patient's last menstrual period was 12/21/2012. Contraception:PMP Menopausal hormone therapy: estradiol         OB History    Gravida  0   Para  0   Term  0   Preterm  0   AB  0   Living  0     SAB  0   TAB  0   Ectopic  0   Multiple  0   Live Births  0              Patient Active Problem List   Diagnosis Date Noted  . Mild intermittent asthma 04/04/2019  . Acquired hypothyroidism 12/31/2017  . Dry eye syndrome 11/20/2016  . Monocular diplopia of both eyes 11/20/2016  . Cervical radiculopathy due to degenerative joint disease of spine 11/05/2016  . Goiter diffuse, heterogeneous 07/16/2011    Past Medical History:  Diagnosis Date  . Abdominal pain   . Arthritis   . Headache(784.0)   . Heart murmur   . Hyperlipidemia   . Lymph node(s) enlarged   . Nasal congestion   . Thyroid disease     Past Surgical History:  Procedure Laterality Date  . CHOLECYSTECTOMY    . glandular staff infection  1970    Current Outpatient Medications  Medication Sig Dispense Refill  . albuterol (PROVENTIL HFA;VENTOLIN HFA) 108 (90 Base) MCG/ACT inhaler Inhale 1-2 puffs into the lungs every 6 (six) hours as needed for wheezing or shortness of breath. 1 Inhaler 0  . estradiol (VIVELLE-DOT) 0.025 MG/24HR Place onto the skin.    . Levothyroxine Sodium (TIROSINT) 100 MCG CAPS Take by mouth daily before  breakfast.    . naproxen (NAPROSYN) 500 MG tablet Take 1 tablet (500 mg total) by mouth 2 (two) times daily with a meal. 14 tablet 0  . progesterone (PROMETRIUM) 200 MG capsule SMARTSIG:1 Capsule(s) By Mouth Every Night    . valACYclovir (VALTREX) 500 MG tablet      No current facility-administered medications for this visit.     ALLERGIES: Prednisone and Latex  Family History  Problem Relation Age of Onset  . Heart disease Father   . Cancer Maternal Grandmother        breast  . Cancer Paternal Grandmother        colon    Social History   Socioeconomic History  . Marital status: Single    Spouse name: Not on file  . Number of children: Not on file  . Years of education: Not on file  . Highest education level: Not on file  Occupational History  . Not on file  Tobacco Use  . Smoking status: Never Smoker  . Smokeless tobacco: Never Used  Substance and Sexual Activity  . Alcohol use: Yes    Comment: socially/2x month  . Drug use: No  . Sexual activity: Yes  Birth control/protection: Post-menopausal  Other Topics Concern  . Not on file  Social History Narrative  . Not on file   Social Determinants of Health   Financial Resource Strain:   . Difficulty of Paying Living Expenses:   Food Insecurity:   . Worried About Charity fundraiser in the Last Year:   . Arboriculturist in the Last Year:   Transportation Needs:   . Film/video editor (Medical):   Marland Kitchen Lack of Transportation (Non-Medical):   Physical Activity:   . Days of Exercise per Week:   . Minutes of Exercise per Session:   Stress:   . Feeling of Stress :   Social Connections:   . Frequency of Communication with Friends and Family:   . Frequency of Social Gatherings with Friends and Family:   . Attends Religious Services:   . Active Member of Clubs or Organizations:   . Attends Archivist Meetings:   Marland Kitchen Marital Status:   Intimate Partner Violence:   . Fear of Current or Ex-Partner:   .  Emotionally Abused:   Marland Kitchen Physically Abused:   . Sexually Abused:     Review of Systems  All other systems reviewed and are negative.   PHYSICAL EXAMINATION:    BP 100/60   Pulse 82   Temp 98.4 F (36.9 C)   Ht 5\' 1"  (1.549 m)   Wt 151 lb 12.8 oz (68.9 kg)   LMP 12/21/2012   SpO2 99%   BMI 28.68 kg/m     General appearance: alert, cooperative and appears stated age Abdomen: soft, tender in the right lateral lower quadrant; minimally distended, no masses,  no organomegaly. Not tender with tensing of her abdominal muscles.   Reviewed ultrasound images with the patient. Thin endometrial stripe, normal ovaries bilaterally, several small fibroids, normal sized uterus.   ASSESSMENT PMP bleeding, negative evaluation HRT, low dose, doing well Urinary hesitancy, I have reviewed Urology notes, normal PVR. She is f/u with pelvic floor PT tomorrow for her bladder dysfunction Lower abdominal pain, negative GYN evaluation. Not c/w MS pain. Suspect GI etiology    PLAN Patient to call if she has on going vaginal bleeding She will continue on her current low dose HRT Referral placed to Dr Carlean Purl for further evaluation and treatment of her pain and loose stools.    In addition to reviewing the ultrasound, another 10 minutes was spent in patient care in regards to her abdominal pain and GI issues.

## 2020-02-19 MED ORDER — VALACYCLOVIR HCL 500 MG PO TABS
ORAL_TABLET | ORAL | 10 refills | 10.00000 days
Start: 2020-02-19 — End: ?

## 2020-02-20 MED ORDER — VALACYCLOVIR HCL 500 MG PO TABS
ORAL_TABLET | ORAL | 10 refills | 10.00000 days | Status: AC
Start: 2020-02-20 — End: ?

## 2020-02-29 ENCOUNTER — Telehealth: Payer: Self-pay | Admitting: Obstetrics and Gynecology

## 2020-02-29 NOTE — Telephone Encounter (Signed)
Call placed to follow up with patient on [facility] referral.

## 2020-05-17 MED ORDER — VALACYCLOVIR HCL 500 MG PO TABS
ORAL_TABLET | ORAL | 1 refills | 10.00000 days
Start: 2020-05-17 — End: ?

## 2020-05-18 MED ORDER — VALACYCLOVIR HCL 500 MG PO TABS
ORAL_TABLET | 1 refills | Status: AC
Start: 2020-05-18 — End: ?

## 2020-06-01 ENCOUNTER — Other Ambulatory Visit: Payer: Self-pay | Admitting: Family Medicine

## 2020-06-01 DIAGNOSIS — E069 Thyroiditis, unspecified: Secondary | ICD-10-CM

## 2020-07-25 ENCOUNTER — Other Ambulatory Visit: Payer: Self-pay

## 2020-07-25 NOTE — Telephone Encounter (Signed)
Refill on estradiol patch. [facility] at [phone]

## 2020-07-25 NOTE — Telephone Encounter (Signed)
Tried calling patient to confirm how many times per week patch is applied. No answer, unable to leave a message due to mailbox being full.

## 2020-07-30 ENCOUNTER — Other Ambulatory Visit: Payer: Self-pay | Admitting: Obstetrics and Gynecology

## 2020-07-30 DIAGNOSIS — Z79890 Hormone replacement therapy (postmenopausal): Secondary | ICD-10-CM

## 2020-07-30 NOTE — Telephone Encounter (Signed)
See new refill request.   York Cerise

## 2020-07-30 NOTE — Telephone Encounter (Signed)
Please call the patient. The amount of estrogen requested and the amount in her chart is different. I didn't prescribe her HRT, where did she get it? We also have in the chart that she is on 200 mg of prometrium daily, probably could be on 100 mg. Please discuss her hrt with her and where she got it.  The patient needs a mammogram and an annual exam. Please schedule her annual. Let me know what you figure out about her HRT.

## 2020-07-30 NOTE — Telephone Encounter (Signed)
Medication refill request: Estradiol 0.1 Last AEX: 02/07/20  Next AEX: not scheduled  Last MMG (if hormonal medication request): NA Refill authorized: 24/0

## 2020-08-01 MED ORDER — PROGESTERONE MICRONIZED 100 MG PO CAPS: 100.0000 mg | ORAL_CAPSULE | Freq: Every day | ORAL | 0 refills | Status: DC

## 2020-08-01 NOTE — Telephone Encounter (Signed)
The patient had a negative evaluation for PMP bleeding earlier this year. I have refilled her HRT x 3 months, but lowered her prometrium.dose. She should calendar any bleeding, may need further evaluation if she continues to bleed.

## 2020-08-01 NOTE — Telephone Encounter (Signed)
Spoke with patient regarding HRT medications. She states her previous doctor in [place] has been refilling Rxs but will no longer do this for her. She was on lower dose of Estradiol but had requested to increase due to the bleeding she had earlier in the year, same with the Progesterone--she began taking 2 tablets of Progesterone 100mg . Patient has been without both Estradiol patch and Progesterone for 1 week and has had some spotting. She is requesting a refill from [place].  Advised patient Dr.Jertson may want her to decrease strength on both and she understands. Made AEX for 10-19-19 10:30AM. She was given phone #to [facility] and she will call to schedule screening mammogram. Advised she had to have updated MMG.  Rxs pending and routed to provider.

## 2020-08-21 ENCOUNTER — Other Ambulatory Visit: Payer: Self-pay | Admitting: Family Medicine

## 2020-08-21 DIAGNOSIS — N950 Postmenopausal bleeding: Secondary | ICD-10-CM

## 2020-09-14 ENCOUNTER — Other Ambulatory Visit: Payer: Self-pay

## 2020-09-27 ENCOUNTER — Ambulatory Visit
Admission: RE | Admit: 2020-09-27 | Discharge: 2020-09-27 | Disposition: A | Discharge status: Home / Self Care | Payer: Self-pay | Source: Ambulatory Visit | Attending: Family Medicine | Admitting: Family Medicine

## 2020-09-27 DIAGNOSIS — N950 Postmenopausal bleeding: Secondary | ICD-10-CM

## 2020-10-08 ENCOUNTER — Ambulatory Visit: Payer: Self-pay | Admitting: Podiatry

## 2020-10-16 ENCOUNTER — Other Ambulatory Visit: Payer: Self-pay | Admitting: Obstetrics and Gynecology

## 2020-10-16 DIAGNOSIS — Z79890 Hormone replacement therapy (postmenopausal): Secondary | ICD-10-CM

## 2020-10-16 MED ORDER — PROGESTERONE MICRONIZED 100 MG PO CAPS: 100.0000 mg | ORAL_CAPSULE | Freq: Every day | ORAL | 0 refills | Status: DC

## 2020-10-16 NOTE — Telephone Encounter (Signed)
The patient is overdue for an annual exam and a mammogram. I've called in a one month refill of her estrogen patch and her prometrium. Please get her in for an annual exam within the next month and tell her she needs a mammogram to continue on HRT (she was advised to set up these appointments in 11/21). If she had her mammogram elsewhere please get a copy of it. Thanks!

## 2020-10-16 NOTE — Telephone Encounter (Signed)
I spoke with patient and advised her.Alexis Armstrong will call her to schedule AEX hopefully within the mos.  Patient thinks her last mammogram was in [place]. I gave her [facility] info and told her they will want her to get her films from the last mammo so they have comparison films.  She will call to schedule visit.

## 2020-10-16 NOTE — Telephone Encounter (Signed)
Visit 01/25/2020. Not sure if that was considered annual exam. She was here for a problem.

## 2020-10-18 ENCOUNTER — Ambulatory Visit: Payer: Self-pay | Admitting: Obstetrics and Gynecology

## 2020-10-31 ENCOUNTER — Ambulatory Visit: Payer: Self-pay | Admitting: Podiatry

## 2020-11-19 MED ORDER — VALACYCLOVIR HCL 500 MG PO TABS
ORAL_TABLET | 1 refills | Status: AC
Start: 2020-11-19 — End: ?

## 2020-11-20 ENCOUNTER — Other Ambulatory Visit: Payer: Self-pay | Admitting: Obstetrics and Gynecology

## 2020-11-20 DIAGNOSIS — Z79890 Hormone replacement therapy (postmenopausal): Secondary | ICD-10-CM

## 2020-11-20 NOTE — Telephone Encounter (Signed)
AEX scheduled 12/11/20

## 2020-11-21 MED ORDER — PROGESTERONE MICRONIZED 100 MG PO CAPS: 100.0000 mg | ORAL_CAPSULE | Freq: Every day | ORAL | 0 refills | Status: DC

## 2020-11-21 NOTE — Telephone Encounter (Signed)
Script for estrogen and progesterone sent to her pharmacy

## 2020-12-11 ENCOUNTER — Ambulatory Visit (INDEPENDENT_AMBULATORY_CARE_PROVIDER_SITE_OTHER): Payer: Self-pay | Admitting: Obstetrics and Gynecology

## 2020-12-11 ENCOUNTER — Encounter: Payer: Self-pay | Admitting: Obstetrics and Gynecology

## 2020-12-11 ENCOUNTER — Other Ambulatory Visit (HOSPITAL_COMMUNITY)
Admission: RE | Admit: 2020-12-11 | Discharge: 2020-12-11 | Disposition: A | Discharge status: Home / Self Care | Payer: BC Managed Care – PPO | Source: Ambulatory Visit | Attending: Obstetrics and Gynecology | Admitting: Obstetrics and Gynecology

## 2020-12-11 ENCOUNTER — Other Ambulatory Visit: Payer: Self-pay

## 2020-12-11 VITALS — BP 132/66 | HR 77 | Ht 61.0 in | Wt 151.0 lb

## 2020-12-11 DIAGNOSIS — Z79890 Hormone replacement therapy (postmenopausal): Secondary | ICD-10-CM

## 2020-12-11 DIAGNOSIS — Z01419 Encounter for gynecological examination (general) (routine) without abnormal findings: Secondary | ICD-10-CM

## 2020-12-11 DIAGNOSIS — Z124 Encounter for screening for malignant neoplasm of cervix: Secondary | ICD-10-CM

## 2020-12-11 DIAGNOSIS — N882 Stricture and stenosis of cervix uteri: Secondary | ICD-10-CM

## 2020-12-11 DIAGNOSIS — N950 Postmenopausal bleeding: Secondary | ICD-10-CM

## 2020-12-11 DIAGNOSIS — R350 Frequency of micturition: Secondary | ICD-10-CM

## 2020-12-11 DIAGNOSIS — E559 Vitamin D deficiency, unspecified: Secondary | ICD-10-CM

## 2020-12-11 LAB — URINALYSIS, COMPLETE W/RFL CULTURE
Bacteria, UA: NONE SEEN /HPF
Bilirubin Urine: NEGATIVE
Glucose, UA: NEGATIVE
Hgb urine dipstick: NEGATIVE
Hyaline Cast: NONE SEEN /LPF
Ketones, ur: NEGATIVE
Leukocyte Esterase: NEGATIVE
Nitrites, Initial: NEGATIVE
Protein, ur: NEGATIVE
RBC / HPF: NONE SEEN /HPF (ref 0–2)
Specific Gravity, Urine: 1.006 (ref 1.001–1.03)
WBC, UA: NONE SEEN /HPF (ref 0–5)
pH: 5 (ref 5.0–8.0)

## 2020-12-11 LAB — NO CULTURE INDICATED

## 2020-12-11 MED ORDER — MISOPROSTOL 200 MCG PO TABS: ORAL_TABLET | ORAL | 0 refills | Status: DC

## 2020-12-11 MED ORDER — ESTRADIOL 0.0375 MG/24HR TD PTTW: 1.0000 | MEDICATED_PATCH | TRANSDERMAL | 3 refills | Status: DC

## 2020-12-11 MED ORDER — PROGESTERONE MICRONIZED 100 MG PO CAPS: 100.0000 mg | ORAL_CAPSULE | Freq: Every day | ORAL | 3 refills | Status: DC

## 2020-12-11 NOTE — Progress Notes (Signed)
60 y.o. G0P0000 Single White or Caucasian Not Hispanic or Latino female here for annual exam.  She has been having abdomen pain. She states that she was having heavy bleeding.  She says that when she doesn't have her patch she starts to bleed again.   The patient is on HRT, last May she was seen and had an endometrial biopsy for bleeding that returned with atrophy and breakdown. She had bleeding after that for several months.   Ultrasound on 09/27/20: IMPRESSION: 1. Endometrial stripe measures 4.4 mm in thickness, within normal limits. In the setting of post-menopausal bleeding, this is consistent with a benign etiology such as endometrial atrophy. If bleeding remains unresponsive to hormonal or medical therapy, sonohysterogram should be considered for focal lesion work-up. (Ref: Radiological Reasoning: Algorithmic Workup of Abnormal Vaginal Bleeding with Endovaginal Sonography and Sonohysterography. AJR 2008; 361:W43-15). 2. 1.4 cm intramural fibroid at the anterior mid uterine body. 3. Nonvisualization of either ovary. No adnexal mass or free fluid within the pelvis.  Her primary increased her estrogen patch from 0.05 to a 0.1 mg estrogen patch and increased her prometrium to 200 mg.  Now she is taking 1/2 of a 0.1 mg patch and 100 mg of prometrium.  She bleed for 6 weeks, ended in early February. She finds that if she doesn't change her patch on time she has some bleeding.   If she is late changing her patch she starts having some vasomotor symptoms. No symptoms as long as she is using the patch.   Sexually active, occasional dyspareunia if not well lubricated.     She has some urinary frequency and urgency to void with some pressure. It has been going on for a long time, worse in the last year. No dysuria.   Patient's last menstrual period was 12/21/2012.          Sexually active: Yes.    The current method of family planning is post menopausal status.    Exercising: No.  The  patient does not participate in regular exercise at present. Smoker:  no  Health Maintenance: Pap:  Over two years ago per patient History of abnormal Pap:  yes MMG:  10/22/2018 normal  BMD:  Never  Colonoscopy: July 2020 normal per patient TDaP:  Unsure  Gardasil: none    reports that she has never smoked. She has never used smokeless tobacco. She reports current alcohol use. She reports that she does not use drugs. Plans to retire in 1/23, not going to work between now and then. 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 Thinks she is going to get married.   Past Medical History:  Diagnosis Date  . Abdominal pain   . Arthritis   . Headache(784.0)   . Heart murmur   . Hyperlipidemia   . Lymph node(s) enlarged   . Nasal congestion   . Thyroid disease     Past Surgical History:  Procedure Laterality Date  . CHOLECYSTECTOMY    . glandular staff infection  1970    Current Outpatient Medications  Medication Sig Dispense Refill  . albuterol (PROVENTIL HFA;VENTOLIN HFA) 108 (90 Base) MCG/ACT inhaler Inhale 1-2 puffs into the lungs every 6 (six) hours as needed for wheezing or shortness of breath. 1 Inhaler 0  . estradiol (VIVELLE-DOT) 0.1 MG/24HR patch APPLY 1 PATCH TWICE WEEKLY 8 patch 0  . Levothyroxine Sodium 100 MCG CAPS Take by mouth daily before breakfast.    . naproxen (NAPROSYN) 500 MG tablet  Take 1 tablet (500 mg total) by mouth 2 (two) times daily with a meal. 14 tablet 0  . progesterone (PROMETRIUM) 100 MG capsule Take 1 capsule (100 mg total) by mouth daily. 30 capsule 0  . valACYclovir (VALTREX) 500 MG tablet      No current facility-administered medications for this visit.    Family History  Problem Relation Age of Onset  . Heart disease Father   . Cancer Maternal Grandmother        breast  . Cancer Paternal Grandmother        colon    Review of Systems  All other systems reviewed and are negative.   Exam:   BP 132/66   Pulse 77   Ht 5\' 1"   (1.549 m)   Wt 151 lb (68.5 kg)   LMP 12/21/2012   SpO2 97%   BMI 28.53 kg/m   Weight change: @WEIGHTCHANGE @ Height:   Height: 5\' 1"  (154.9 cm)  Ht Readings from Last 3 Encounters:  12/11/20 5\' 1"  (1.549 m)  02/07/20 5\' 1"  (1.549 m)  01/25/20 5' 0.25" (1.53 m)    General appearance: alert, cooperative and appears stated age Head: Normocephalic, without obvious abnormality, atraumatic Neck: no adenopathy, supple, symmetrical, trachea midline and thyroid normal to inspection and palpation Lungs: clear to auscultation bilaterally Cardiovascular: regular rate and rhythm Breasts: normal appearance, no masses or tenderness Abdomen: soft, non-tender; non distended,  no masses,  no organomegaly Extremities: extremities normal, atraumatic, no cyanosis or edema Skin: Skin color, texture, turgor normal. No rashes or lesions Lymph nodes: Cervical, supraclavicular, and axillary nodes normal. No abnormal inguinal nodes palpated Neurologic: Grossly normal   Pelvic: External genitalia:  no lesions              Urethra:  normal appearing urethra with no masses, tenderness or lesions              Bartholins and Skenes: normal                 Vagina: normal appearing vagina with normal color and discharge, no lesions              Cervix: no lesions               Bimanual Exam:  Uterus:  normal size, contour, position, consistency, mobility, non-tender              Adnexa: no mass, fullness, tenderness               Rectovaginal: Confirms               Anus:  normal sphincter tone, no lesions  Gae Dry chaperoned for the exam.  1. Well woman exam Discussed breast self exam Discussed calcium and vit D intake Mammogram due, # given Colonoscopy UTD  2. Hormone replacement therapy (HRT) Decrease estrogen dose as tolerated - estradiol (VIVELLE-DOT) 0.0375 MG/24HR; Place 1 patch onto the skin 2 (two) times a week.  Dispense: 24 patch; Refill: 3 - progesterone (PROMETRIUM) 100 MG capsule;  Take 1 capsule (100 mg total) by mouth daily.  Dispense: 90 capsule; Refill: 3  3. Screening for cervical cancer - Cytology - PAP with hpv  4. Vitamin D deficiency Labs with primary  5. Postmenopausal bleeding Negative biopsy in 5/21, ultrasound with stripe of 4.4 mm in 1/22. Had 6 week episode of bleeding, needs further evaluation - misoprostol (CYTOTEC) 200 MCG tablet; Place 2 tablets vaginally 6-12 hours prior to procedure.  Dispense: 2 tablet; Refill: 0 - [place] Sonohysterogram; Future  6. Cervical stenosis (uterine cervix) - misoprostol (CYTOTEC) 200 MCG tablet; Place 2 tablets vaginally 6-12 hours prior to procedure.  Dispense: 2 tablet; Refill: 0  7. Urinary frequency - Urinalysis,Complete w/RFL Culture

## 2020-12-11 NOTE — Patient Instructions (Signed)

## 2020-12-12 LAB — CYTOLOGY - PAP
Adequacy: ABSENT
Comment: NEGATIVE
Diagnosis: NEGATIVE
High risk HPV: NEGATIVE

## 2020-12-15 ENCOUNTER — Other Ambulatory Visit: Payer: Self-pay | Admitting: Obstetrics and Gynecology

## 2020-12-15 DIAGNOSIS — Z79890 Hormone replacement therapy (postmenopausal): Secondary | ICD-10-CM

## 2021-01-23 ENCOUNTER — Telehealth: Payer: BLUE CROSS/BLUE SHIELD

## 2021-01-23 DIAGNOSIS — Z760 Encounter for issue of repeat prescription: Secondary | ICD-10-CM

## 2021-01-23 MED ORDER — LEVOTHYROXINE SODIUM 112 MCG PO CAPS
ORAL_CAPSULE | 3 refills
Start: 2021-01-23 — End: ?

## 2021-01-23 NOTE — Telephone Encounter
Will call pt.

## 2021-01-23 NOTE — Telephone Encounter
Refill Request  Pt is running out of meds.  Also requesting to speak with Dr. Francine Graven nurse.   Verified patient was seen within the last 6 months. If not seen within 6 months, offered appointment. LOV 03/31/19     Collected prescription information from patient.     Pended orders for provider to review and sign.     Pharmacy location was confirmed and class was set for pended orders.    Patient has been notified of the 24-48 hour turnaround time.

## 2021-01-24 NOTE — Telephone Encounter
Informed pt a appointment will be needed- she made a video appt for Monday- if labs are needed she will be returning next day to do labs

## 2021-01-28 ENCOUNTER — Ambulatory Visit: Payer: BLUE CROSS/BLUE SHIELD

## 2021-01-28 ENCOUNTER — Telehealth: Payer: BLUE CROSS/BLUE SHIELD

## 2021-01-28 NOTE — Telephone Encounter
Patient's appointment with Dr.Solomon was rescheduled: Provider not available.    Future Appointments    Jan 31, 2021  4:00 PM  Video Visit - Return with Coralyn Pear. Elisabeth Cara, [facility]) [address]  [place] [address]  [phone]

## 2021-01-31 ENCOUNTER — Ambulatory Visit: Payer: BLUE CROSS/BLUE SHIELD

## 2021-09-26 LAB — HM MAMMOGRAPHY

## 2021-12-13 ENCOUNTER — Other Ambulatory Visit: Payer: Self-pay | Admitting: Obstetrics and Gynecology

## 2021-12-13 DIAGNOSIS — N951 Menopausal and female climacteric states: Secondary | ICD-10-CM

## 2021-12-24 MED ORDER — VALACYCLOVIR HCL 500 MG PO TABS
ORAL_TABLET | 1 refills
Start: 2021-12-24 — End: ?

## 2021-12-25 MED ORDER — VALACYCLOVIR HCL 500 MG PO TABS
ORAL_TABLET | 1 refills | Status: AC
Start: 2021-12-25 — End: ?

## 2022-02-06 ENCOUNTER — Ambulatory Visit (INDEPENDENT_AMBULATORY_CARE_PROVIDER_SITE_OTHER): Payer: BC Managed Care – PPO | Admitting: Primary Care

## 2022-02-06 ENCOUNTER — Encounter: Payer: Self-pay | Admitting: Primary Care

## 2022-02-06 VITALS — BP 134/78 | HR 66 | Temp 98.1°F | Ht 60.0 in | Wt 150.0 lb

## 2022-02-06 DIAGNOSIS — N951 Menopausal and female climacteric states: Secondary | ICD-10-CM

## 2022-02-06 DIAGNOSIS — Z8619 Personal history of other infectious and parasitic diseases: Secondary | ICD-10-CM

## 2022-02-06 DIAGNOSIS — G629 Polyneuropathy, unspecified: Secondary | ICD-10-CM

## 2022-02-06 DIAGNOSIS — E040 Nontoxic diffuse goiter: Secondary | ICD-10-CM

## 2022-02-06 DIAGNOSIS — E039 Hypothyroidism, unspecified: Secondary | ICD-10-CM | POA: Diagnosis not present

## 2022-02-06 DIAGNOSIS — J4520 Mild intermittent asthma, uncomplicated: Secondary | ICD-10-CM

## 2022-02-06 DIAGNOSIS — Z1159 Encounter for screening for other viral diseases: Secondary | ICD-10-CM

## 2022-02-06 DIAGNOSIS — J3089 Other allergic rhinitis: Secondary | ICD-10-CM | POA: Insufficient documentation

## 2022-02-06 DIAGNOSIS — K219 Gastro-esophageal reflux disease without esophagitis: Secondary | ICD-10-CM | POA: Insufficient documentation

## 2022-02-06 DIAGNOSIS — Z114 Encounter for screening for human immunodeficiency virus [HIV]: Secondary | ICD-10-CM

## 2022-02-06 LAB — COMPREHENSIVE METABOLIC PANEL
ALT: 23 U/L (ref 0–35)
AST: 18 U/L (ref 0–37)
Albumin: 4.6 g/dL (ref 3.5–5.2)
Alkaline Phosphatase: 84 U/L (ref 39–117)
BUN: 11 mg/dL (ref 6–23)
CO2: 30 mEq/L (ref 19–32)
Calcium: 10 mg/dL (ref 8.4–10.5)
Chloride: 103 mEq/L (ref 96–112)
Creatinine, Ser: 0.8 mg/dL (ref 0.40–1.20)
GFR: 80.38 mL/min (ref >60.00)
Glucose, Bld: 93 mg/dL (ref 70–99)
Potassium: 4.6 mEq/L (ref 3.5–5.1)
Sodium: 141 mEq/L (ref 135–145)
Total Bilirubin: 0.6 mg/dL (ref 0.2–1.2)
Total Protein: 7.4 g/dL (ref 6.0–8.3)

## 2022-02-06 LAB — CBC
HCT: 43.7 % (ref 36.0–46.0)
Hemoglobin: 14.5 g/dL (ref 12.0–15.0)
MCHC: 33.2 g/dL (ref 30.0–36.0)
MCV: 90.3 fl (ref 78.0–100.0)
Platelets: 268 10*3/uL (ref 150.0–400.0)
RBC: 4.83 Mil/uL (ref 3.87–5.11)
RDW: 13.5 % (ref 11.5–15.5)
WBC: 6.2 10*3/uL (ref 4.0–10.5)

## 2022-02-06 LAB — TSH: TSH: 0.07 u[IU]/mL — ABNORMAL LOW (ref 0.35–5.50)

## 2022-02-06 MED ORDER — VALACYCLOVIR HCL 500 MG PO TABS: 500.0000 mg | ORAL_TABLET | Freq: Every day | ORAL | 3 refills | Status: DC

## 2022-02-06 MED ORDER — ALBUTEROL SULFATE HFA 108 (90 BASE) MCG/ACT IN AERS: 1.0000 | INHALATION_SPRAY | Freq: Four times a day (QID) | RESPIRATORY_TRACT | 0 refills | Status: AC | PRN

## 2022-02-06 MED ORDER — FAMOTIDINE 20 MG PO TABS: ORAL_TABLET | ORAL | 3 refills | Status: DC

## 2022-02-06 MED ORDER — GABAPENTIN 100 MG PO CAPS: ORAL_CAPSULE | ORAL | 3 refills | Status: DC

## 2022-02-06 NOTE — Assessment & Plan Note (Signed)
Repeat thyroid ultrasound pending.

## 2022-02-06 NOTE — Progress Notes (Signed)
Subjective:    Patient ID: Alexis Armstrong, female    DOB: [birth year], 61 y.o.   MRN: [id number]  HPI  Alexis Armstrong is a very pleasant 61 y.o. female who presents today who presents today to establish care and discuss the problems mentioned below. Will obtain/review records.  1) Asthma: Mild, intermittent. Mostly affected by bleach. Infrequent use of her albuterol inhaler. She does not have an inhaler at home.   2) Hypothyroidism: Acquired. Currently managed on levothyroxine (Tirosent) 137 mcg tablets. Her dose was increased after increased levels of TSH secondary to hormonal treatment with testosterone pellets. No recent TSH on file.   She is taking her levothyroxine every morning on an empty stomach with water only. No food or other medications for 30 minutes. No heartburn medication, iron pills, calcium, vitamin D, or magnesium pills within four hours of taking levothyroxine.   Her last TSH was 5 months ago. She underwent thyroid ultrasound in 2020 which showed decreased size of her thyroid gland compared to 2014, no focal thyroids.  She would like to have this repeated.  3) Neuropathy: Chronic to both feet, diagnosed 3-4 years ago. She worked as Probation officer for 30 years. Currently managed on gabapentin 100 mg, takes 2-3 capsules HS  with improvement in symptoms.   She feels well managed.   4) Hot Flashes: Currently managed on estradiol patches and progesterone 100 mg daily. Follows with GYN and feels well managed. She underwent pelvic/transvaginal US for post menopausal bleeding in January 2022.   5) History of Herpes Zoster/Genital Herpes: History of herpes zoster about 4 years ago. She is currently managed on Valtrex 500 mg daily for herpes outbreak prevention.   6) Seasonal Allergies: Chronic symptoms of nasal congestion mostly. She is currently managed on Advil Cold and Sinus PRN with improvement. She doesn't like to use steroidal nasal sprays.     Review of Systems  HENT:   Positive for postnasal drip.   Respiratory:  Negative for chest tightness and shortness of breath.   Cardiovascular:  Negative for chest pain.  Genitourinary:        Hot flashes  Allergic/Immunologic: Positive for environmental allergies.        Past Medical History:  Diagnosis Date   Abdominal pain    Arthritis    Headache(784.0)    Heart murmur    Hyperlipidemia    Lymph node(s) enlarged    Nasal congestion    Thyroid disease     Social History   Socioeconomic History   Marital status: Single    Spouse name: Not on file   Number of children: Not on file   Years of education: Not on file   Highest education level: Not on file  Occupational History   Not on file  Tobacco Use   Smoking status: Never   Smokeless tobacco: Never  Vaping Use   Vaping Use: Never used  Substance and Sexual Activity   Alcohol use: Yes    Comment: socially/2x month   Drug use: No   Sexual activity: Yes    Birth control/protection: Post-menopausal  Other Topics Concern   Not on file  Social History Narrative   Not on file   Social Determinants of Health   Financial Resource Strain: Not on file  Food Insecurity: Not on file  Transportation Needs: Not on file  Physical Activity: Not on file  Stress: Not on file  Social Connections: Not on file  Intimate Partner Violence: Not on file  Past Surgical History:  Procedure Laterality Date   CHOLECYSTECTOMY     glandular staff infection  1970    Family History  Problem Relation Age of Onset   Heart disease Father    Cancer Maternal Grandmother        breast   Cancer Paternal Grandmother        colon    Allergies  Allergen Reactions   Prednisone Anaphylaxis   Latex Rash    Current Outpatient Medications on File Prior to Visit  Medication Sig Dispense Refill   albuterol (PROVENTIL HFA;VENTOLIN HFA) 108 (90 Base) MCG/ACT inhaler Inhale 1-2 puffs into the lungs every 6 (six) hours as needed for wheezing or shortness of  breath. 1 Inhaler 0   estradiol (VIVELLE-DOT) 0.0375 MG/24HR Place 1 patch onto the skin 2 (two) times a week. 24 patch 3   progesterone (PROMETRIUM) 100 MG capsule Take 1 capsule (100 mg total) by mouth daily. 90 capsule 3   valACYclovir (VALTREX) 500 MG tablet      famotidine (PEPCID) 20 MG tablet famotidine 20 mg tablet  TAKE 1 TABLET BY MOUTH TWICE A DAY     gabapentin (NEURONTIN) 100 MG capsule gabapentin 100 mg capsule  TAKE 3 CAPSULES EVERY DAY BY ORAL ROUTE AT BEDTIME FOR 90 DAYS.     Levothyroxine Sodium (TIROSINT) 137 MCG CAPS Tirosint 137 mcg capsule  TAKE 1 CAPSULE BY MOUTH EVERY DAY     No current facility-administered medications on file prior to visit.    BP 134/78   Pulse 66   Temp 98.1 F (36.7 C) (Oral)   Ht 5' (1.524 m)   Wt 150 lb (68 kg)   LMP 12/21/2012   SpO2 98%   BMI 29.29 kg/m  Objective:   Physical Exam Cardiovascular:     Rate and Rhythm: Normal rate and regular rhythm.  Pulmonary:     Effort: Pulmonary effort is normal.     Breath sounds: Normal breath sounds.  Abdominal:     General: Bowel sounds are normal.     Palpations: Abdomen is soft.     Tenderness: There is no abdominal tenderness.  Musculoskeletal:        General: Normal range of motion.     Cervical back: Neck supple.  Skin:    General: Skin is warm and dry.  Neurological:     Mental Status: She is alert.  Psychiatric:        Mood and Affect: Mood normal.          Assessment & Plan:

## 2022-02-06 NOTE — Assessment & Plan Note (Signed)
She is taking levothyroxine correctly.  Continue levothyroxine (Tirosent) 137 mcg daily. Repeat TSH pending.

## 2022-02-06 NOTE — Assessment & Plan Note (Signed)
Following with GYN.  Continue progesterone 100 mg daily, estradiol patches as prescribed.

## 2022-02-06 NOTE — Assessment & Plan Note (Signed)
Controlled.  Continue famotidine 20 mg daily. Refills provided.

## 2022-02-06 NOTE — Assessment & Plan Note (Signed)
Discussed use of Flonase nasal spray, antihistamine treatment.  Discussed to avoid recurrent use of decongestants.  Consider Singulair.

## 2022-02-06 NOTE — Assessment & Plan Note (Signed)
Controlled.  Exam today negative. Continue albuterol inhaler PRN.  Refill sent to pharmacy.

## 2022-02-06 NOTE — Assessment & Plan Note (Signed)
Controlled.  Continue gabapentin 200 to 300 mg at bedtime, refills provided.

## 2022-02-06 NOTE — Assessment & Plan Note (Signed)
Controlled.  Continue Valtrex 500 mg daily.  

## 2022-02-06 NOTE — Patient Instructions (Signed)
Stop by the lab prior to leaving today. I will notify you of your results once received.   You will be contacted regarding your thyroid ultrasound.  Please let us know if you have not been contacted within two weeks.   It was a pleasure to see you today!   

## 2022-02-07 LAB — HIV ANTIBODY (ROUTINE TESTING W REFLEX): HIV 1&2 Ab, 4th Generation: NONREACTIVE

## 2022-02-07 LAB — HEPATITIS C ANTIBODY
Hepatitis C Ab: NONREACTIVE
SIGNAL TO CUT-OFF: 0.08 (ref <1.00)

## 2022-02-13 ENCOUNTER — Other Ambulatory Visit: Payer: Self-pay | Admitting: Primary Care

## 2022-02-13 ENCOUNTER — Telehealth: Payer: Self-pay

## 2022-02-13 DIAGNOSIS — E039 Hypothyroidism, unspecified: Secondary | ICD-10-CM

## 2022-02-13 MED ORDER — LEVOTHYROXINE SODIUM 112 MCG PO CAPS: ORAL_CAPSULE | ORAL | 0 refills | Status: DC

## 2022-02-13 NOTE — Telephone Encounter (Signed)
Patient would like a returned phone call, states she forgot to tell Joellen something.

## 2022-02-14 ENCOUNTER — Encounter: Payer: Self-pay | Admitting: Primary Care

## 2022-02-14 ENCOUNTER — Ambulatory Visit (INDEPENDENT_AMBULATORY_CARE_PROVIDER_SITE_OTHER): Payer: BC Managed Care – PPO | Admitting: Primary Care

## 2022-02-14 VITALS — BP 126/78 | HR 90 | Temp 98.4°F | Ht 60.0 in | Wt 148.0 lb

## 2022-02-14 DIAGNOSIS — J3089 Other allergic rhinitis: Secondary | ICD-10-CM

## 2022-02-14 DIAGNOSIS — E559 Vitamin D deficiency, unspecified: Secondary | ICD-10-CM

## 2022-02-14 DIAGNOSIS — J342 Deviated nasal septum: Secondary | ICD-10-CM

## 2022-02-14 DIAGNOSIS — R222 Localized swelling, mass and lump, trunk: Secondary | ICD-10-CM

## 2022-02-14 DIAGNOSIS — E039 Hypothyroidism, unspecified: Secondary | ICD-10-CM

## 2022-02-14 NOTE — Patient Instructions (Addendum)
You will be contacted regarding your referral to ENT and also for the second ultrasound.  Please let us know if you have not been contacted within two weeks.   It was a pleasure to see you today!

## 2022-02-14 NOTE — Assessment & Plan Note (Signed)
Over treated per recent TSH. Start levothyroxine 112 mcg.   Repeat labs in 2 months.

## 2022-02-14 NOTE — Assessment & Plan Note (Signed)
Unclear etiology, likely benign but will rule out other causes. Soft tissue ultrasound chest ordered and pending.

## 2022-02-14 NOTE — Progress Notes (Signed)
Subjective:    Patient ID: Alexis Armstrong, female    DOB: 08/11/1961, 61 y.o.   MRN: [id number]  HPI  Garry Bochicchio is a very pleasant 61 y.o. female with a history of hypothyroidism, cervical radiculopathy due to DDD, neuropathy who presents today to discuss chest wall swelling, chronic deviated septum.   Chronic history of right upper chest/clavicular chest wall swelling for years. A close friend noticed this a few years ago and pointed it out. Over the years she's noticed slight increased swelling to the site.   She has chronic allergies with symptoms of nasal congestion, post nasal drip, head congestion, chest congestion. Also with history of deviated septum.   She is pending a thyroid ultrasound and is requesting evaluation of her right upper chest. She is also requesting to see ENT for chronic deviated septum with chronic allergies.  Recently, several friends/acquaintances have been diagnosed with various cancers. A few have since passed away.  BP Readings from Last 3 Encounters:  02/14/22 126/78  02/06/22 134/78  12/11/20 132/66      Review of Systems  HENT:  Positive for congestion and postnasal drip.   Respiratory:  Positive for cough.   Cardiovascular:  Negative for chest pain.  Allergic/Immunologic: Positive for environmental allergies.       Chest wall swelling         Past Medical History:  Diagnosis Date   Abdominal pain    Arthritis    Dry eye syndrome 11/20/2016   Headache(784.0)    Heart murmur    Hyperlipidemia    Lymph node(s) enlarged    Nasal congestion    Thyroid disease     Social History   Socioeconomic History   Marital status: Single    Spouse name: Not on file   Number of children: Not on file   Years of education: Not on file   Highest education level: Not on file  Occupational History   Not on file  Tobacco Use   Smoking status: Never   Smokeless tobacco: Never  Vaping Use   Vaping Use: Never used  Substance and Sexual  Activity   Alcohol use: Yes    Comment: socially/2x month   Drug use: No   Sexual activity: Yes    Birth control/protection: Post-menopausal  Other Topics Concern   Not on file  Social History Narrative   Not on file   Social Determinants of Health   Financial Resource Strain: Not on file  Food Insecurity: Not on file  Transportation Needs: Not on file  Physical Activity: Not on file  Stress: Not on file  Social Connections: Not on file  Intimate Partner Violence: Not on file    Past Surgical History:  Procedure Laterality Date   CHOLECYSTECTOMY     glandular staff infection  1970    Family History  Problem Relation Age of Onset   Heart disease Father    Cancer Maternal Grandmother        breast   Cancer Paternal Grandmother        colon    Allergies  Allergen Reactions   Prednisone Anaphylaxis   Latex Rash    Current Outpatient Medications on File Prior to Visit  Medication Sig Dispense Refill   albuterol (VENTOLIN HFA) 108 (90 Base) MCG/ACT inhaler Inhale 1-2 puffs into the lungs every 6 (six) hours as needed for wheezing or shortness of breath. 1 each 0   estradiol (VIVELLE-DOT) 0.05 MG/24HR patch Place 1 patch onto the  skin 2 (two) times a week.     famotidine (PEPCID) 20 MG tablet Take 1 tablet by mouth once daily for heartburn. 90 tablet 3   gabapentin (NEURONTIN) 100 MG capsule Take 2-3 capsules by mouth at bedtime for neuropathy 270 capsule 3   levocetirizine (XYZAL) 5 MG tablet Take 5 mg by mouth every evening.     Levothyroxine Sodium (TIROSINT) 112 MCG CAPS Take 1 tablet by mouth every morning on an empty stomach with water only.  No food or other medications for 30 minutes. 90 capsule 0   progesterone (PROMETRIUM) 100 MG capsule Take 1 capsule (100 mg total) by mouth daily. 90 capsule 3   valACYclovir (VALTREX) 500 MG tablet Take 1 tablet (500 mg total) by mouth daily. 90 tablet 3   No current facility-administered medications on file prior to visit.     BP 126/78   Pulse 90   Temp 98.4 F (36.9 C) (Oral)   Ht 5' (1.524 m)   Wt 148 lb (67.1 kg)   LMP 12/21/2012   SpO2 96%   BMI 28.90 kg/m  Objective:   Physical Exam Constitutional:      Appearance: She is not ill-appearing.  Pulmonary:     Effort: Pulmonary effort is normal.     Breath sounds: Normal breath sounds. No rhonchi.     Comments: Congested cough noted during visit Chest:       Comments: Mild chest wall swelling to right upper anterior chest distal to clavicle. Non tender. No obvious engorged lymph nodes.  Skin:    General: Skin is warm and dry.  Neurological:     Mental Status: She is alert.           Assessment & Plan:   Problem List Items Addressed This Visit       Endocrine   Acquired hypothyroidism    Over treated per recent TSH. Start levothyroxine 112 mcg.   Repeat labs in 2 months.        Other   Environmental and seasonal allergies    Agree to place ENT referral for evaluation of deviated septum. Referral placed.       Localized swelling of chest wall - Primary    Unclear etiology, likely benign but will rule out other causes. Soft tissue ultrasound chest ordered and pending.      Relevant Orders   [place] CHEST SOFT TISSUE   Vitamin D deficiency    Repeat vitamin D pending.      Relevant Orders   VITAMIN D 25 Hydroxy (Vit-D Deficiency, Fractures)   Other Visit Diagnoses     Deviated septum       Relevant Orders   Ambulatory referral to ENT          Pleas Koch, NP

## 2022-02-14 NOTE — Assessment & Plan Note (Signed)
Agree to place ENT referral for evaluation of deviated septum. Referral placed.

## 2022-02-14 NOTE — Assessment & Plan Note (Signed)
Repeat vitamin D pending. 

## 2022-02-17 ENCOUNTER — Telehealth: Payer: Self-pay

## 2022-02-17 NOTE — Telephone Encounter (Signed)
Since this is a new concern I would recommend an office visit to evaluate how urgently she should have imaging done and discuss treatment options.   Imaging make sense, however, this is very far from the location of concern during her OV with Anda Kraft.   I can see her at 1220 tomorrow to evaluate.   Also - we will want to know where she got her mammogram as will need to return to the same location (or the new location will need records).

## 2022-02-17 NOTE — Telephone Encounter (Signed)
Patient had never had any pain in her breast that this is new. She had mammogram 3 months ago but was sent letter telling her that she had dense tissue. She could not remember the location. She is requesting that we order u/s to check on that. She would like done in [place].

## 2022-02-17 NOTE — Telephone Encounter (Signed)
Please Verify when her last mammogram was.   If pain has moved to the breast I would recommend breast imaging. Need to know when the last mammo was and where to determine appropriate imaging

## 2022-02-17 NOTE — Telephone Encounter (Signed)
Patient had an appointment on Friday for swelling/pain in chest. States that the pain is now localized on L breast and is wanting to know what she can do for this pain. States that she has had a mammogram in the past and results were dense breast tissue. Alexis Armstrong states that the [place] wont go over the breast tissue, and is concerned about the dull pain she has in her breast. Alexis Armstrong is asking if the [place] is still a good choice to go through with.

## 2022-02-18 ENCOUNTER — Other Ambulatory Visit: Payer: BC Managed Care – PPO

## 2022-02-19 ENCOUNTER — Other Ambulatory Visit: Payer: BC Managed Care – PPO

## 2022-02-21 ENCOUNTER — Other Ambulatory Visit: Payer: BC Managed Care – PPO

## 2022-02-21 NOTE — Telephone Encounter (Signed)
Left message to return call to our office.  

## 2022-02-26 ENCOUNTER — Encounter: Payer: Self-pay | Admitting: Primary Care

## 2022-02-27 ENCOUNTER — Ambulatory Visit
Admission: RE | Admit: 2022-02-27 | Discharge: 2022-02-27 | Disposition: A | Discharge status: Home / Self Care | Payer: BC Managed Care – PPO | Source: Ambulatory Visit | Attending: Primary Care | Admitting: Primary Care

## 2022-02-27 DIAGNOSIS — E039 Hypothyroidism, unspecified: Secondary | ICD-10-CM

## 2022-02-27 DIAGNOSIS — R222 Localized swelling, mass and lump, trunk: Secondary | ICD-10-CM

## 2022-02-27 DIAGNOSIS — E040 Nontoxic diffuse goiter: Secondary | ICD-10-CM

## 2022-02-28 ENCOUNTER — Other Ambulatory Visit: Payer: Self-pay | Admitting: Primary Care

## 2022-02-28 DIAGNOSIS — R222 Localized swelling, mass and lump, trunk: Secondary | ICD-10-CM

## 2022-02-28 DIAGNOSIS — E039 Hypothyroidism, unspecified: Secondary | ICD-10-CM

## 2022-02-28 DIAGNOSIS — D351 Benign neoplasm of parathyroid gland: Secondary | ICD-10-CM

## 2022-02-28 NOTE — Telephone Encounter (Signed)
I can see a future order for CT abdomen.  I do not think this will adequately view the breast tissue and recommend follow-up visit.

## 2022-02-28 NOTE — Telephone Encounter (Signed)
Called patient appointment made for next week with Alexis Armstrong. She did not want to be seen by anyone else. She does have lab appointment on Monday.

## 2022-03-03 ENCOUNTER — Other Ambulatory Visit (INDEPENDENT_AMBULATORY_CARE_PROVIDER_SITE_OTHER): Payer: BC Managed Care – PPO

## 2022-03-03 DIAGNOSIS — E559 Vitamin D deficiency, unspecified: Secondary | ICD-10-CM | POA: Diagnosis not present

## 2022-03-03 DIAGNOSIS — E039 Hypothyroidism, unspecified: Secondary | ICD-10-CM

## 2022-03-03 LAB — VITAMIN D 25 HYDROXY (VIT D DEFICIENCY, FRACTURES): VITD: 58.94 ng/mL (ref 30.00–100.00)

## 2022-03-03 LAB — TSH: TSH: 0.3 u[IU]/mL — ABNORMAL LOW (ref 0.35–5.50)

## 2022-03-04 LAB — PTH, INTACT AND CALCIUM
Calcium: 9.8 mg/dL (ref 8.6–10.4)
PTH: 70 pg/mL (ref 16–77)

## 2022-03-06 ENCOUNTER — Ambulatory Visit
Admission: RE | Admit: 2022-03-06 | Discharge: 2022-03-06 | Disposition: A | Discharge status: Home / Self Care | Payer: BC Managed Care – PPO | Source: Ambulatory Visit | Attending: Primary Care | Admitting: Primary Care

## 2022-03-06 DIAGNOSIS — E039 Hypothyroidism, unspecified: Secondary | ICD-10-CM

## 2022-03-06 DIAGNOSIS — D351 Benign neoplasm of parathyroid gland: Secondary | ICD-10-CM

## 2022-03-06 DIAGNOSIS — R222 Localized swelling, mass and lump, trunk: Secondary | ICD-10-CM

## 2022-03-06 MED ORDER — IOPAMIDOL (ISOVUE-300) INJECTION 61%
75.0000 mL | Freq: Once | INTRAVENOUS | Status: AC | PRN
  Administered 2022-03-06: 75 mL via INTRAVENOUS

## 2022-03-07 ENCOUNTER — Ambulatory Visit (INDEPENDENT_AMBULATORY_CARE_PROVIDER_SITE_OTHER): Payer: BC Managed Care – PPO | Admitting: Primary Care

## 2022-03-07 ENCOUNTER — Encounter: Payer: Self-pay | Admitting: Primary Care

## 2022-03-07 ENCOUNTER — Ambulatory Visit (INDEPENDENT_AMBULATORY_CARE_PROVIDER_SITE_OTHER)
Admission: RE | Admit: 2022-03-07 | Discharge: 2022-03-07 | Disposition: A | Discharge status: Home / Self Care | Payer: BC Managed Care – PPO | Source: Ambulatory Visit | Attending: Primary Care | Admitting: Primary Care

## 2022-03-07 VITALS — BP 102/68 | HR 78 | Temp 98.0°F | Ht 60.0 in | Wt 147.0 lb

## 2022-03-07 DIAGNOSIS — M542 Cervicalgia: Secondary | ICD-10-CM

## 2022-03-07 DIAGNOSIS — M79602 Pain in left arm: Secondary | ICD-10-CM | POA: Insufficient documentation

## 2022-03-07 DIAGNOSIS — E559 Vitamin D deficiency, unspecified: Secondary | ICD-10-CM

## 2022-03-07 DIAGNOSIS — R202 Paresthesia of skin: Secondary | ICD-10-CM | POA: Diagnosis not present

## 2022-03-07 DIAGNOSIS — R222 Localized swelling, mass and lump, trunk: Secondary | ICD-10-CM

## 2022-03-07 DIAGNOSIS — I272 Pulmonary hypertension, unspecified: Secondary | ICD-10-CM

## 2022-03-07 DIAGNOSIS — M79601 Pain in right arm: Secondary | ICD-10-CM

## 2022-03-07 DIAGNOSIS — H532 Diplopia: Secondary | ICD-10-CM

## 2022-03-07 DIAGNOSIS — Z8249 Family history of ischemic heart disease and other diseases of the circulatory system: Secondary | ICD-10-CM | POA: Insufficient documentation

## 2022-03-07 DIAGNOSIS — G8929 Other chronic pain: Secondary | ICD-10-CM | POA: Diagnosis not present

## 2022-03-07 NOTE — Assessment & Plan Note (Signed)
Referral placed to neurology for evaluation of diplopia, upper extremity compression. Need to rule out multiple sclerosis or any other potential neurological cause for symptoms.

## 2022-03-07 NOTE — Assessment & Plan Note (Signed)
Today her swelling is mostly localized to the right lateral neck for which appears to be secondary to muscle spasm.  This could very well be secondary to her cervical degenerative disc disease.  Updating cervical spine plain films today. Offered muscle relaxer for which she currently declined.  Reviewed CT soft tissue neck and ultrasound chest with patient today.

## 2022-03-07 NOTE — Assessment & Plan Note (Signed)
Updating cervical spine plain films today.  Cervical disc degeneration could be contributing to her upper extremity symptoms, but do need to rule out neurological cause. Also suspect right lateral neck swelling is secondary to muscle tightness/spasm.  Discussed this today.  Await results.  Consider orthopedic evaluation.

## 2022-03-07 NOTE — Progress Notes (Signed)
Situations better  Subjective:    Patient ID: Alexis Armstrong, female    DOB: June 28, 1961, 61 y.o.   MRN: [id number]  HPI  Alexis Armstrong is a very pleasant 61 y.o. female with a history of GERD, hypothyroidism with goiter, cervical radiculopathy, chronic neck pain, neuropathy, diplopia, chest wall swelling who presents today to discuss neck pain, recent CT scan results, and family history of CAD.   Chronic history of neck pain for years, increased pain over the last 6 months. Her pain is located to the right posterior and lateral neck.  Continued swelling to the right lateral neck.  She does experience "compression and pain" to bilateral upper extremities and up right side of her face.  She has seen 2 ophthalmologists in the past, both of which cannot find a cause for her diplopia.  She has never seen neurology.  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  She underwent labs including vitamin D, PTH, calcium; all of which were within normal range.  She has a significant family history of early CAD in her father who passed away at the age 80. Also with CAD in her mother and her side of the family.  She was evaluated by cardiology in her early 3s, otherwise has not seen cardiology since.  She suspects she has an internal fungal infection for which she contracted years ago in [place].  Symptoms include intermittent rashes to her extremities, worse with showering.  Evaluated by dermatology who was told this was not a dermatological issue.  She would like to undergo testing for internal fungal etiology, is not sure who to see.  Review of Systems  Respiratory:  Negative for shortness of breath.   Cardiovascular:  Negative for chest pain.  Musculoskeletal:  Positive for arthralgias and neck pain.  Skin:  Positive for rash.  Neurological:  Positive for numbness.          Past Medical History:  Diagnosis Date   Abdominal pain    Arthritis    Cervical radiculopathy due to degenerative joint disease of spine 11/05/2016   Dry eye syndrome 11/20/2016   Headache(784.0)    Heart murmur    Hyperlipidemia    Lymph node(s) enlarged    Nasal congestion    Thyroid disease     Social History   Socioeconomic History   Marital status: Single    Spouse name: Not on file   Number of children: Not on file   Years of education: Not on file   Highest education level: Not on file  Occupational History   Not on file  Tobacco Use   Smoking status: Never   Smokeless tobacco: Never  Vaping Use   Vaping Use: Never used  Substance and Sexual Activity   Alcohol use: Yes    Comment: socially/2x month   Drug use: No   Sexual activity: Yes    Birth control/protection: Post-menopausal  Other Topics Concern   Not on file  Social History Narrative   Not on file   Social Determinants of Health   Financial Resource Strain: Not on file  Food Insecurity: Not on file  Transportation Needs: Not on file  Physical Activity: Not on file  Stress: Not on file  Social Connections: Not on file  Intimate Partner Violence: Not on file    Past Surgical History:  Procedure Laterality Date   CHOLECYSTECTOMY  glandular staff infection  1970    Family History  Problem Relation Age of Onset   Heart disease Father    Cancer Maternal Grandmother        breast   Cancer Paternal Grandmother        colon    Allergies  Allergen Reactions   Prednisone Anaphylaxis   Latex Rash    Current Outpatient Medications on File Prior to Visit  Medication Sig Dispense Refill   albuterol (VENTOLIN HFA) 108 (90 Base) MCG/ACT inhaler Inhale 1-2 puffs into the lungs every 6 (six) hours as needed for wheezing or shortness of breath. 1 each 0   estradiol (VIVELLE-DOT) 0.05 MG/24HR patch Place 1 patch onto the skin 2 (two) times a week.     famotidine (PEPCID) 20 MG tablet Take 1  tablet by mouth once daily for heartburn. 90 tablet 3   gabapentin (NEURONTIN) 100 MG capsule Take 2-3 capsules by mouth at bedtime for neuropathy 270 capsule 3   levocetirizine (XYZAL) 5 MG tablet Take 5 mg by mouth every evening.     Levothyroxine Sodium (TIROSINT) 112 MCG CAPS Take 1 tablet by mouth every morning on an empty stomach with water only.  No food or other medications for 30 minutes. 90 capsule 0   progesterone (PROMETRIUM) 100 MG capsule Take 1 capsule (100 mg total) by mouth daily. 90 capsule 3   valACYclovir (VALTREX) 500 MG tablet Take 1 tablet (500 mg total) by mouth daily. 90 tablet 3   No current facility-administered medications on file prior to visit.    BP 102/68   Pulse 78   Temp 98 F (36.7 C) (Oral)   Ht 5' (1.524 m)   Wt 147 lb (66.7 kg)   LMP 12/21/2012   SpO2 98%   BMI 28.71 kg/m  Objective:   Physical Exam Neck:      Comments: Swelling noted to the right anterior neck over the sternocleidomastoid muscle.  Muscular tenderness noted. Cardiovascular:     Rate and Rhythm: Normal rate and regular rhythm.  Pulmonary:     Effort: Pulmonary effort is normal.     Breath sounds: Normal breath sounds.  Musculoskeletal:     Cervical back: Normal range of motion and neck supple. Tenderness present. Pain with movement present. Normal range of motion.  Lymphadenopathy:     Cervical: No cervical adenopathy.  Skin:    General: Skin is warm and dry.           Assessment & Plan:   Problem List Items Addressed This Visit       Cardiovascular and Mediastinum   Pulmonary hypertension ([place])    Incidental finding on CT scan.  Echocardiogram orders placed. Referral placed to cardiology.      Relevant Orders   Ambulatory referral to Cardiology   ECHOCARDIOGRAM COMPLETE     Other   Monocular diplopia of both eyes    Referral placed to neurology for evaluation of diplopia, upper extremity compression. Need to rule out multiple sclerosis or any other  potential neurological cause for symptoms.      Relevant Orders   Ambulatory referral to Neurology   Localized swelling of chest wall    Today her swelling is mostly localized to the right lateral neck for which appears to be secondary to muscle spasm.  This could very well be secondary to her cervical degenerative disc disease.  Updating cervical spine plain films today. Offered muscle relaxer for which she currently declined.  Reviewed  CT soft tissue neck and ultrasound chest with patient today.      Vitamin D deficiency    Recent vitamin D level within normal range.      Paresthesia and pain of both upper extremities    Likely secondary to cervical disc disease, however do need to rule out neurological cause given her diplopia and other symptoms. Referral placed to neurology for further evaluation.      Relevant Orders   DG Cervical Spine Complete   Ambulatory referral to Neurology   Chronic neck pain - Primary    Updating cervical spine plain films today.  Cervical disc degeneration could be contributing to her upper extremity symptoms, but do need to rule out neurological cause. Also suspect right lateral neck swelling is secondary to muscle tightness/spasm.  Discussed this today.  Await results.  Consider orthopedic evaluation.       Relevant Orders   DG Cervical Spine Complete   Family history of early CAD    Referral placed to cardiology for evaluation, especially given the recent incidental finding of pulmonary hypertension.      Relevant Orders   Ambulatory referral to Cardiology       Pleas Koch, NP

## 2022-03-07 NOTE — Assessment & Plan Note (Signed)
Recent vitamin D level within normal range.

## 2022-03-07 NOTE — Assessment & Plan Note (Signed)
Likely secondary to cervical disc disease, however do need to rule out neurological cause given her diplopia and other symptoms. Referral placed to neurology for further evaluation.

## 2022-03-07 NOTE — Assessment & Plan Note (Signed)
Incidental finding on CT scan.  Echocardiogram orders placed. Referral placed to cardiology.

## 2022-03-07 NOTE — Assessment & Plan Note (Signed)
Referral placed to cardiology for evaluation, especially given the recent incidental finding of pulmonary hypertension.

## 2022-03-07 NOTE — Patient Instructions (Signed)
You will be contacted regarding your referral to cardiology, neurology, and for the echocardiogram.  Please let us know if you have not been contacted within two weeks.   Complete xray(s) prior to leaving today. I will notify you of your results once received.  Schedule a lab appointment for 1 month to repeat your thyroid testing.  It was a pleasure to see you today!

## 2022-03-17 ENCOUNTER — Encounter: Payer: Self-pay | Admitting: Interventional Cardiology

## 2022-03-17 ENCOUNTER — Ambulatory Visit (INDEPENDENT_AMBULATORY_CARE_PROVIDER_SITE_OTHER): Payer: BC Managed Care – PPO | Admitting: Interventional Cardiology

## 2022-03-17 VITALS — BP 88/68 | HR 69 | Ht 61.0 in | Wt 147.0 lb

## 2022-03-17 DIAGNOSIS — R072 Precordial pain: Secondary | ICD-10-CM | POA: Diagnosis not present

## 2022-03-17 DIAGNOSIS — Q2579 Other congenital malformations of pulmonary artery: Secondary | ICD-10-CM | POA: Diagnosis not present

## 2022-03-17 MED ORDER — METOPROLOL TARTRATE 25 MG PO TABS: ORAL_TABLET | ORAL | 0 refills | Status: DC

## 2022-03-17 NOTE — Patient Instructions (Addendum)
Medication Instructions:  Your physician recommends that you continue on your current medications as directed. Please refer to the Current Medication list given to you today.  *If you need a refill on your cardiac medications before your next appointment, please call your pharmacy*   Lab Work: Lab work to be done today--BMP If you have labs (blood work) drawn today and your tests are completely normal, you will receive your results only by: [place] (if you have MyChart) OR A paper copy in the mail If you have any lab test that is abnormal or we need to change your treatment, we will call you to review the results.   Testing/Procedures: Your physician has requested that you have cardiac CT. Cardiac computed tomography (CT) is a painless test that uses an x-ray machine to take clear, detailed pictures of your heart. For further information please visit [email or web address]. Please follow instruction sheet as given.     Follow-Up: At [facility], you and your health needs are our priority.  As part of our continuing mission to provide you with exceptional heart care, we have created designated Provider Care Teams.  These Care Teams include your primary Cardiologist (physician) and Advanced Practice Providers (APPs -  Physician Assistants and Nurse Practitioners) who all work together to provide you with the care you need, when you need it.  We recommend signing up for the patient portal called "MyChart".  Sign up information is provided on this After Visit Summary.  MyChart is used to connect with patients for Virtual Visits (Telemedicine).  Patients are able to view lab/test results, encounter notes, upcoming appointments, etc.  Non-urgent messages can be sent to your provider as well.   To learn more about what you can do with MyChart, go to [email or web address].    Your next appointment:   Based on results  The format for your next appointment:   In Person  Provider:    Larae Grooms, MD     Other Instructions    Your cardiac CT will be scheduled at one of the below locations:   [facility] [address] [place], [place] [address] [phone]  [place] [address] [place], [place] [address] [phone]  If scheduled at [facility], please arrive at the [facility] and Children's Entrance (Entrance C2) of [facility] 30 minutes prior to test start time. You can use the FREE valet parking offered at entrance C (encouraged to control the heart rate for the test)  Proceed to the [facility] Radiology Department (first floor) to check-in and test prep.  All radiology patients and guests should use entrance C2 at [facility], accessed from [facility], even though the hospital's physical address listed is [address]    If scheduled at [facility], please arrive 15 mins early for check-in and test prep.  Please follow these instructions carefully (unless otherwise directed):   On the Night Before the Test: Be sure to Drink plenty of water. Do not consume any caffeinated/decaffeinated beverages or chocolate 12 hours prior to your test. Do not take any antihistamines 12 hours prior to your test.   On the Day of the Test: Drink plenty of water until 1 hour prior to the test. Do not eat any food 4 hours prior to the test. You may take your regular medications prior to the test.  Take metoprolol (Lopressor) two hours prior to test. HOLD  Furosemide/Hydrochlorothiazide morning of the test. FEMALES- please wear underwire-free bra if available, avoid dresses & tight clothing         After the Test: Drink plenty of water. After receiving IV contrast, you may experience a mild flushed feeling. This is normal. On occasion, you may experience a mild rash up to 24 hours after the test. This is not  dangerous. If this occurs, you can take Benadryl 25 mg and increase your fluid intake. If you experience trouble breathing, this can be serious. If it is severe call 911 IMMEDIATELY. If it is mild, please call our office. If you take any of these medications: Glipizide/Metformin, Avandament, Glucavance, please do not take 48 hours after completing test unless otherwise instructed.  We will call to schedule your test 2-4 weeks out understanding that some insurance companies will need an authorization prior to the service being performed.   For non-scheduling related questions, please contact the cardiac imaging nurse navigator should you have any questions/concerns: Marchia Bond, Cardiac Imaging Nurse Navigator Gordy Clement, Cardiac Imaging Nurse Navigator [facility] Direct Office Dial: [phone]   For scheduling needs, including cancellations and rescheduling, please call [place], [phone].   Important Information About Sugar

## 2022-03-17 NOTE — Progress Notes (Signed)
Cardiology Office Note   Date:  03/17/2022   ID:  Alexis Armstrong, DOB [birth year], MRN [id number]  PCP:  Pleas Koch, NP    No chief complaint on file.  ? Pulm HTN  Wt Readings from Last 3 Encounters:  03/17/22 147 lb (66.7 kg)  03/07/22 147 lb (66.7 kg)  02/14/22 148 lb (67.1 kg)       History of Present Illness: Alexis Armstrong is a 61 y.o. female who is being seen today for the evaluation of pulm artery dilation at the request of Pleas Koch, NP.   Father had some type of cardiac issue at a young age.  Maternal grandfather with sudden death at a young age.  No siblings with cardiac issues.    She has had palpitations.  She has had some DOE, particularly with walking stairs.   Denies :  Dizziness. Leg edema. Nitroglycerin use. Orthopnea. Palpitations. Paroxysmal nocturnal dyspnea. Shortness of breath. Syncope.    She has had chest pain.  They feel like an electric shock and have been repetitive.   She has been told she snores.      Past Medical History:  Diagnosis Date   Abdominal pain    Arthritis    Cervical radiculopathy due to degenerative joint disease of spine 11/05/2016   Dry eye syndrome 11/20/2016   Headache(784.0)    Heart murmur    Hyperlipidemia    Lymph node(s) enlarged    Nasal congestion    Thyroid disease     Past Surgical History:  Procedure Laterality Date   CHOLECYSTECTOMY     glandular staff infection  1970     Current Outpatient Medications  Medication Sig Dispense Refill   albuterol (VENTOLIN HFA) 108 (90 Base) MCG/ACT inhaler Inhale 1-2 puffs into the lungs every 6 (six) hours as needed for wheezing or shortness of breath. 1 each 0   estradiol (VIVELLE-DOT) 0.05 MG/24HR patch Place 1 patch onto the skin 2 (two) times a week.     famotidine (PEPCID) 20 MG tablet Take 1 tablet by mouth once daily for heartburn. 90 tablet 3   gabapentin (NEURONTIN) 100 MG capsule Take 2-3 capsules by mouth at bedtime for neuropathy 270  capsule 3   levocetirizine (XYZAL) 5 MG tablet Take 5 mg by mouth every evening.     Levothyroxine Sodium (TIROSINT) 112 MCG CAPS Take 1 tablet by mouth every morning on an empty stomach with water only.  No food or other medications for 30 minutes. 90 capsule 0   progesterone (PROMETRIUM) 100 MG capsule Take 1 capsule (100 mg total) by mouth daily. 90 capsule 3   valACYclovir (VALTREX) 500 MG tablet Take 1 tablet (500 mg total) by mouth daily. 90 tablet 3   No current facility-administered medications for this visit.    Allergies:   Prednisone and Latex    Social History:  The patient  reports that she has never smoked. She has never used smokeless tobacco. She reports current alcohol use. She reports that she does not use drugs.   Family History:  The patient's family history includes Cancer in her maternal grandmother and paternal grandmother; Heart disease in her father.    ROS:  Please see the history of present illness.   Otherwise, review of systems are positive for chest pain.   All other systems are reviewed and negative.    PHYSICAL EXAM: VS:  BP (!) 88/68   Pulse 69   Ht '5\' 1"'$  (1.549 m)  Wt 147 lb (66.7 kg)   LMP 12/21/2012   SpO2 97%   BMI 27.78 kg/m  , BMI Body mass index is 27.78 kg/m. GEN: Well nourished, well developed, in no acute distress HEENT: normal Neck: no JVD, carotid bruits, or masses Cardiac: RRR; no murmurs, mid systolic click, no rubs, or gallops,no edema  Respiratory:  clear to auscultation bilaterally, normal work of breathing GI: soft, nontender, nondistended, + BS MS: no deformity or atrophy Skin: warm and dry, no rash Neuro:  Strength and sensation are intact Psych: euthymic mood, full affect   EKG:   The ekg ordered today demonstrates NSR, rSR'   Recent Labs: 02/06/2022: ALT 23; BUN 11; Creatinine, Ser 0.80; Hemoglobin 14.5; Platelets 268.0; Potassium 4.6; Sodium 141 03/03/2022: TSH 0.30   Lipid Panel No results found for: "CHOL",  "TRIG", "HDL", "CHOLHDL", "VLDL", "LDLCALC", "LDLDIRECT"   Other studies Reviewed: Additional studies/ records that were reviewed today with results demonstrating: .   ASSESSMENT AND PLAN:  Chest pain: Some atypical features but patient with strong family h/o CAD.   Plan for CTA coronaries.  Resting heart rate was in the 60s during the visit.  Her systolic blood pressure is borderline.  We will give metoprolol 25 mg prior to the CT.  She is not having any dizziness or lightheadedness. Await echo results.  Cardiac exam sounds like mitral valve prolapse.  No associated murmur.  We will also see if her pulmonary artery pressure could be calculated on the echocardiogram.  If PA pressure is high, would consider sleep study.  She has been told she snores before.     Current medicines are reviewed at length with the patient today.  The patient concerns regarding her medicines were addressed.  The following changes have been made:  No change  Labs/ tests ordered today include:  No orders of the defined types were placed in this encounter.   Recommend 150 minutes/week of aerobic exercise Low fat, low carb, high fiber diet recommended  Disposition:   FU in for CTA   Signed, Larae Grooms, MD  03/17/2022 4:13 PM    [facility] [place], [place], [place]  [address] Phone: [phone]; Fax: [phone]

## 2022-03-18 LAB — BASIC METABOLIC PANEL
BUN/Creatinine Ratio: 21 (ref 12–28)
BUN: 19 mg/dL (ref 8–27)
CO2: 23 mmol/L (ref 20–29)
Calcium: 9.8 mg/dL (ref 8.7–10.3)
Chloride: 101 mmol/L (ref 96–106)
Creatinine, Ser: 0.89 mg/dL (ref 0.57–1.00)
Glucose: 107 mg/dL — ABNORMAL HIGH (ref 70–99)
Potassium: 4.4 mmol/L (ref 3.5–5.2)
Sodium: 141 mmol/L (ref 134–144)
eGFR: 74 mL/min/{1.73_m2} (ref >59)

## 2022-04-01 ENCOUNTER — Other Ambulatory Visit: Payer: Self-pay | Admitting: Primary Care

## 2022-04-01 DIAGNOSIS — E039 Hypothyroidism, unspecified: Secondary | ICD-10-CM

## 2022-04-02 ENCOUNTER — Encounter: Payer: Self-pay | Admitting: Diagnostic Neuroimaging

## 2022-04-02 ENCOUNTER — Ambulatory Visit: Payer: BC Managed Care – PPO | Admitting: Diagnostic Neuroimaging

## 2022-04-02 VITALS — BP 87/63 | HR 66 | Ht 61.0 in | Wt 149.0 lb

## 2022-04-02 DIAGNOSIS — R200 Anesthesia of skin: Secondary | ICD-10-CM | POA: Diagnosis not present

## 2022-04-02 DIAGNOSIS — H538 Other visual disturbances: Secondary | ICD-10-CM | POA: Diagnosis not present

## 2022-04-02 DIAGNOSIS — H532 Diplopia: Secondary | ICD-10-CM
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
## 2022-04-03 ENCOUNTER — Telehealth: Payer: Self-pay | Admitting: Diagnostic Neuroimaging

## 2022-04-03 NOTE — Telephone Encounter (Signed)
Referral sent to [facility] [phone]

## 2022-04-03 NOTE — Telephone Encounter (Signed)
Alexis Armstrong: [id number] exp. 04/03/22-05/02/22 sent to GI per [facility]

## 2022-04-08 ENCOUNTER — Other Ambulatory Visit: Payer: Self-pay | Admitting: Primary Care

## 2022-04-08 ENCOUNTER — Telehealth (HOSPITAL_COMMUNITY): Payer: Self-pay | Admitting: Emergency Medicine

## 2022-04-08 ENCOUNTER — Ambulatory Visit: Payer: BC Managed Care – PPO | Admitting: Neurology

## 2022-04-08 DIAGNOSIS — E039 Hypothyroidism, unspecified: Secondary | ICD-10-CM

## 2022-04-08 NOTE — Telephone Encounter (Signed)
Reaching out to patient to offer assistance regarding upcoming cardiac imaging study; pt verbalizes understanding of appt date/time, parking situation and where to check in, pre-test NPO status and medications ordered, and verified current allergies; name and call back number provided for further questions should they arise Alexis Bond RN Navigator [facility] [phone] office [phone] cell  Difficult IV 230 arrival at w/c entrance Aware nitro Not taking metop, drinking water to protect BP

## 2022-04-09 ENCOUNTER — Ambulatory Visit (HOSPITAL_COMMUNITY)
Admission: RE | Admit: 2022-04-09 | Discharge: 2022-04-09 | Disposition: A | Discharge status: Home / Self Care | Payer: BC Managed Care – PPO | Source: Ambulatory Visit | Attending: Interventional Cardiology | Admitting: Interventional Cardiology

## 2022-04-09 DIAGNOSIS — R072 Precordial pain: Secondary | ICD-10-CM | POA: Insufficient documentation

## 2022-04-09 MED ORDER — NITROGLYCERIN 0.4 MG SL SUBL
SUBLINGUAL_TABLET | SUBLINGUAL | Status: AC
  Filled 2022-04-09: qty 2

## 2022-04-09 MED ORDER — IOHEXOL 350 MG/ML SOLN
100.0000 mL | Freq: Once | INTRAVENOUS | Status: AC | PRN
  Administered 2022-04-09: 100 mL via INTRAVENOUS

## 2022-04-09 MED ORDER — NITROGLYCERIN 0.4 MG SL SUBL
0.8000 mg | SUBLINGUAL_TABLET | Freq: Once | SUBLINGUAL | Status: AC
  Administered 2022-04-09: 0.8 mg via SUBLINGUAL

## 2022-04-14 ENCOUNTER — Other Ambulatory Visit (INDEPENDENT_AMBULATORY_CARE_PROVIDER_SITE_OTHER): Payer: BC Managed Care – PPO

## 2022-04-14 DIAGNOSIS — E039 Hypothyroidism, unspecified: Secondary | ICD-10-CM

## 2022-04-14 LAB — TSH: TSH: 12.46 u[IU]/mL — ABNORMAL HIGH (ref 0.35–5.50)

## 2022-04-15 ENCOUNTER — Telehealth: Payer: Self-pay | Admitting: Primary Care

## 2022-04-15 ENCOUNTER — Other Ambulatory Visit: Payer: Self-pay | Admitting: Primary Care

## 2022-04-15 ENCOUNTER — Ambulatory Visit
Admission: RE | Admit: 2022-04-15 | Discharge: 2022-04-15 | Disposition: A | Discharge status: Home / Self Care | Payer: BC Managed Care – PPO | Source: Ambulatory Visit | Attending: Diagnostic Neuroimaging | Admitting: Diagnostic Neuroimaging

## 2022-04-15 DIAGNOSIS — H532 Diplopia: Secondary | ICD-10-CM

## 2022-04-15 DIAGNOSIS — H538 Other visual disturbances: Secondary | ICD-10-CM

## 2022-04-15 DIAGNOSIS — R200 Anesthesia of skin: Secondary | ICD-10-CM

## 2022-04-15 DIAGNOSIS — E039 Hypothyroidism, unspecified: Secondary | ICD-10-CM

## 2022-04-15 LAB — ANA,IFA RA DIAG PNL W/RFLX TIT/PATN
ANA Titer 1: NEGATIVE
Cyclic Citrullin Peptide Ab: 2 units (ref 0–19)
Rheumatoid fact SerPl-aCnc: 11.3 IU/mL (ref <14.0)

## 2022-04-15 LAB — HEMOGLOBIN A1C
Est. average glucose Bld gHb Est-mCnc: 103 mg/dL
Hgb A1c MFr Bld: 5.2 % (ref 4.8–5.6)

## 2022-04-15 LAB — ACHR ABS WITH REFLEX TO MUSK: AChR Binding Ab, Serum: <0.03 nmol/L (ref 0.00–0.24)

## 2022-04-15 LAB — MUSK ANTIBODIES: MuSK Antibodies: 4.4 U/mL — ABNORMAL HIGH

## 2022-04-15 LAB — VITAMIN B12: Vitamin B-12: 608 pg/mL (ref 232–1245)

## 2022-04-15 LAB — SJOGREN'S SYNDROME ANTIBODS(SSA + SSB)
ENA SSA (RO) Ab: <0.2 AI (ref 0.0–0.9)
ENA SSB (LA) Ab: <0.2 AI (ref 0.0–0.9)

## 2022-04-15 MED ORDER — LEVOTHYROXINE SODIUM 125 MCG PO CAPS: ORAL_CAPSULE | ORAL | 0 refills | Status: DC

## 2022-04-15 MED ORDER — GADOBENATE DIMEGLUMINE 529 MG/ML IV SOLN
13.0000 mL | Freq: Once | INTRAVENOUS | Status: AC | PRN
  Administered 2022-04-15: 13 mL via INTRAVENOUS

## 2022-04-15 MED ORDER — LEVOTHYROXINE SODIUM 112 MCG PO CAPS: ORAL_CAPSULE | ORAL | 0 refills | Status: DC

## 2022-04-15 NOTE — Telephone Encounter (Signed)
   Patient returned call, relayed message to patient as written below and informed that scripts had already been submitted      Francella Solian, [place]  04/15/2022  2:58 PM EDT Back to Top    Left message to return call to our office.    Pleas Koch, NP  04/15/2022  1:41 PM EDT     1. We can check a "full thyroid panel" next time but it is not as helpful as the TSH, and it doesn't change the plan. Will preorder. 2. No dose in between 112 mcg and 125 mcg. 3. Will send in the 125 mcg and 112 mcg doses to pharmacy now.

## 2022-04-16 ENCOUNTER — Ambulatory Visit: Payer: BLUE CROSS/BLUE SHIELD

## 2022-04-16 DIAGNOSIS — Z1231 Encounter for screening mammogram for malignant neoplasm of breast: Secondary | ICD-10-CM

## 2022-04-16 NOTE — Telephone Encounter (Signed)
Called patient she is not sure what she is taking. 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 She has a month of the 112 now and has not had any issues filling in the past. She will call pharmacy and find out what issue is and let us know.

## 2022-04-16 NOTE — Telephone Encounter (Signed)
Please notify patient that I received notification from her pharmacy that her insurance will not cover Tirosint (levothyroxine) capsules in the 112 mcg dose. Has she been receiving tablets rather than capsules of the 112 mcg dose?  They are requesting levothyroxine (Synthroid) tablets now.

## 2022-04-16 NOTE — Telephone Encounter (Signed)
Documented in lab results that patient was informed.

## 2022-04-17 ENCOUNTER — Telehealth: Payer: Self-pay | Admitting: Neurology

## 2022-04-17 NOTE — Telephone Encounter (Signed)
Please result, if normal please send to pt mychart

## 2022-04-17 NOTE — Telephone Encounter (Signed)
Pt is calling and requesting a nurse give her a call to go over MRI results

## 2022-04-21 NOTE — Telephone Encounter (Addendum)
Penumalli, Earlean Polka, MD  P Gna-Pod 3 Results Unremarkable brain imaging results. Please call patient. Continue current plan. 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  Could be related to numbness in hands.  Consider conservative management versus neurosurgery / pain mgmt consult.  -VRP

## 2022-04-23 ENCOUNTER — Ambulatory Visit (HOSPITAL_COMMUNITY): Payer: BC Managed Care – PPO | Attending: Cardiology

## 2022-04-23 DIAGNOSIS — I272 Pulmonary hypertension, unspecified: Secondary | ICD-10-CM | POA: Diagnosis present

## 2022-04-23 LAB — ECHOCARDIOGRAM COMPLETE
Area-P 1/2: 4.13 cm2
S' Lateral: 2.6 cm

## 2022-04-30 ENCOUNTER — Telehealth: Payer: Self-pay | Admitting: *Deleted

## 2022-04-30 NOTE — Telephone Encounter (Signed)
Left a message on voicemail for patient to call the office back. When patient calls back need to give her test results.

## 2022-05-02 NOTE — Telephone Encounter (Signed)
See results note for documentation.

## 2022-05-05 ENCOUNTER — Telehealth: Payer: Self-pay | Admitting: Diagnostic Neuroimaging

## 2022-05-05 NOTE — Telephone Encounter (Signed)
Pt is asking if she needs to make an appointment or if she is going to be called DK:SMMOCAR positive on the Musk antibodies test

## 2022-05-06 ENCOUNTER — Encounter: Payer: Self-pay | Admitting: Diagnostic Neuroimaging

## 2022-05-06 NOTE — Telephone Encounter (Signed)
Called patient and advised her MUSK antibody is elevated, could be related to myasthenia gravis. This can cause double / blurred vision. Can setup video or office visit to discuss. We can also try dose of pyridostigmine to help symptoms. She had multiple questions and would like to come in to discuss. Scheduled FU. Patient verbalized understanding, appreciation.

## 2022-05-07 ENCOUNTER — Telehealth: Payer: Self-pay | Admitting: Diagnostic Neuroimaging

## 2022-05-07 NOTE — Telephone Encounter (Signed)
Contacted pt back, she asked if she was suppose to be taking Gabapentin. Informed her she is take it for neuropathy Dr Leta Baptist didn't d/c the medication or mention anything about it in his note so she should still be taking it but contact ordering physician regarding any  concerns with medication. She also asked if she could take it while she is taking OTC allergy medications, informed her that is fine. She was appreciative for the call back

## 2022-05-07 NOTE — Telephone Encounter (Signed)
Pt called needing to speak to the RN regarding her gabapentin (NEURONTIN) 100 MG capsule and to discuss the other medication that was previously spoke about. Please advise.

## 2022-05-11 ENCOUNTER — Emergency Department (HOSPITAL_BASED_OUTPATIENT_CLINIC_OR_DEPARTMENT_OTHER)
Admission: EM | Admit: 2022-05-11 | Discharge: 2022-05-11 | Disposition: A | Discharge status: Home / Self Care | Payer: BC Managed Care – PPO | Attending: Emergency Medicine | Admitting: Emergency Medicine

## 2022-05-11 ENCOUNTER — Emergency Department (HOSPITAL_BASED_OUTPATIENT_CLINIC_OR_DEPARTMENT_OTHER): Payer: BC Managed Care – PPO

## 2022-05-11 ENCOUNTER — Encounter (HOSPITAL_BASED_OUTPATIENT_CLINIC_OR_DEPARTMENT_OTHER): Payer: Self-pay

## 2022-05-11 DIAGNOSIS — K921 Melena: Secondary | ICD-10-CM | POA: Insufficient documentation

## 2022-05-11 DIAGNOSIS — G7000 Myasthenia gravis without (acute) exacerbation: Secondary | ICD-10-CM | POA: Diagnosis not present

## 2022-05-11 DIAGNOSIS — Z91040 Latex allergy status: Secondary | ICD-10-CM | POA: Insufficient documentation

## 2022-05-11 DIAGNOSIS — R0789 Other chest pain: Secondary | ICD-10-CM | POA: Diagnosis present

## 2022-05-11 HISTORY — DX: Myasthenia gravis without (acute) exacerbation: G70.00

## 2022-05-11 LAB — COMPREHENSIVE METABOLIC PANEL
ALT: 29 U/L (ref 0–44)
AST: 28 U/L (ref 15–41)
Albumin: 5 g/dL (ref 3.5–5.0)
Alkaline Phosphatase: 88 U/L (ref 38–126)
Anion gap: 11 (ref 5–15)
BUN: 13 mg/dL (ref 6–20)
CO2: 24 mmol/L (ref 22–32)
Calcium: 10.9 mg/dL — ABNORMAL HIGH (ref 8.9–10.3)
Chloride: 101 mmol/L (ref 98–111)
Creatinine, Ser: 0.89 mg/dL (ref 0.44–1.00)
GFR, Estimated: >60 mL/min (ref >60)
Glucose, Bld: 99 mg/dL (ref 70–99)
Potassium: 3.9 mmol/L (ref 3.5–5.1)
Sodium: 136 mmol/L (ref 135–145)
Total Bilirubin: 0.7 mg/dL (ref 0.3–1.2)
Total Protein: 8.8 g/dL — ABNORMAL HIGH (ref 6.5–8.1)

## 2022-05-11 LAB — CBC WITH DIFFERENTIAL/PLATELET
Abs Immature Granulocytes: 0.05 10*3/uL (ref 0.00–0.07)
Basophils Absolute: 0.1 10*3/uL (ref 0.0–0.1)
Basophils Relative: 1 %
Eosinophils Absolute: 0.1 10*3/uL (ref 0.0–0.5)
Eosinophils Relative: 2 %
HCT: 46.2 % — ABNORMAL HIGH (ref 36.0–46.0)
Hemoglobin: 15.7 g/dL — ABNORMAL HIGH (ref 12.0–15.0)
Immature Granulocytes: 1 %
Lymphocytes Relative: 26 %
Lymphs Abs: 1.7 10*3/uL (ref 0.7–4.0)
MCH: 30.1 pg (ref 26.0–34.0)
MCHC: 34 g/dL (ref 30.0–36.0)
MCV: 88.5 fL (ref 80.0–100.0)
Monocytes Absolute: 0.4 10*3/uL (ref 0.1–1.0)
Monocytes Relative: 7 %
Neutro Abs: 4.4 10*3/uL (ref 1.7–7.7)
Neutrophils Relative %: 63 %
Platelets: 298 10*3/uL (ref 150–400)
RBC: 5.22 MIL/uL — ABNORMAL HIGH (ref 3.87–5.11)
RDW: 13.3 % (ref 11.5–15.5)
WBC: 6.8 10*3/uL (ref 4.0–10.5)
nRBC: 0 % (ref 0.0–0.2)

## 2022-05-11 LAB — URINALYSIS, ROUTINE W REFLEX MICROSCOPIC
Bilirubin Urine: NEGATIVE
Glucose, UA: NEGATIVE mg/dL
Hgb urine dipstick: NEGATIVE
Ketones, ur: NEGATIVE mg/dL
Leukocytes,Ua: NEGATIVE
Nitrite: NEGATIVE
Protein, ur: NEGATIVE mg/dL
Specific Gravity, Urine: 1.013 (ref 1.005–1.030)
pH: 5 (ref 5.0–8.0)

## 2022-05-11 MED ORDER — PYRIDOSTIGMINE BROMIDE 60 MG PO TABS: 30.0000 mg | ORAL_TABLET | Freq: Three times a day (TID) | ORAL | 0 refills | Status: DC

## 2022-05-11 NOTE — ED Provider Notes (Addendum)
[facility] EMERGENCY DEPT Provider Note   CSN: [id number] Arrival date & time: 05/11/22  0856     History  Chief Complaint  Patient presents with   Dark stools    Alexis Armstrong is a 61 y.o. female.  Patient with new diagnosis of myasthenia gravis at least according to [facility] neurology notes work-up completed between the end of July and here just recently end of August was told that she had a musk of 4.4.  Patient has not been started on any medication for it she was contemplating at that time trying to decide what she would take.  [facility] notes those seem to imply they were trying to get her into the office to discuss treatment options.  Over this weekend patient states she has had increased feeling of shortness of breath.  Is also had a feeling as if she has a urinary tract infection and she noted that she has had some loose bowel movements and they have been green in nature.  Not black no blood.  No significant abdominal pain.  Notes also show that patient has had some chest discomfort and shortness of breath ongoing for a period of time.  Patient's oxygen saturations here are in the upper 90s to 100%.  No respiratory distress noted.  Respiratory rate is 18.  Patient denies any fevers.  Patient does relate some visual changes.       Home Medications Prior to Admission medications   Medication Sig Start Date End Date Taking? Authorizing Provider  pyridostigmine (MESTINON) 60 MG tablet Take 0.5 tablets (30 mg total) by mouth 3 (three) times daily. 05/11/22  Yes Fredia Sorrow, MD  albuterol (VENTOLIN HFA) 108 (90 Base) MCG/ACT inhaler Inhale 1-2 puffs into the lungs every 6 (six) hours as needed for wheezing or shortness of breath. 02/06/22   Pleas Koch, NP  estradiol (VIVELLE-DOT) 0.05 MG/24HR patch Place 1 patch onto the skin 2 (two) times a week.    [provider]  famotidine (PEPCID) 20 MG tablet Take 1 tablet by mouth once daily for heartburn.  02/06/22   Pleas Koch, NP  gabapentin (NEURONTIN) 100 MG capsule Take 2-3 capsules by mouth at bedtime for neuropathy 02/06/22   Pleas Koch, NP  levocetirizine (XYZAL) 5 MG tablet Take 5 mg by mouth every evening.    [provider]  levothyroxine (SYNTHROID) 112 MCG tablet Take 1 tablet by mouth every morning on an empty stomach with water only Wednesday-Saturday.  No food or other medications for 30 minutes. 05/01/22   Pleas Koch, NP  Levothyroxine Sodium (TIROSINT) 125 MCG CAPS Take 1 tablet by mouth every morning on an empty stomach with water only Sunday, Monday, Tuesday.  No food or other medications for 30 minutes. 04/15/22   Pleas Koch, NP  metoprolol tartrate (LOPRESSOR) 25 MG tablet Take one tablet by mouth 2 hours prior to CT Scan 03/17/22   Jettie Booze, MD  progesterone (PROMETRIUM) 100 MG capsule Take 1 capsule (100 mg total) by mouth daily. 12/11/20   Salvadore Dom, MD  valACYclovir (VALTREX) 500 MG tablet Take 1 tablet (500 mg total) by mouth daily. 02/06/22   Pleas Koch, NP      Allergies    Prednisone and Latex    Review of Systems   Review of Systems  Constitutional:  Negative for chills and fever.  HENT:  Negative for ear pain and sore throat.   Eyes:  Negative for pain and  visual disturbance.  Respiratory:  Positive for shortness of breath. Negative for cough.   Cardiovascular:  Negative for chest pain and palpitations.  Gastrointestinal:  Positive for diarrhea. Negative for abdominal pain and vomiting.  Genitourinary:  Positive for dysuria. Negative for hematuria.  Musculoskeletal:  Negative for arthralgias and back pain.  Skin:  Negative for color change and rash.  Neurological:  Negative for seizures and syncope.  All other systems reviewed and are negative.   Physical Exam Updated Vital Signs BP 102/72   Pulse 66   Temp 98.6 F (37 C) (Oral)   Resp 17   LMP 12/21/2012   SpO2 100%  Physical Exam Vitals  and nursing note reviewed.  Constitutional:      General: She is not in acute distress.    Appearance: Normal appearance. She is well-developed.  HENT:     Head: Normocephalic and atraumatic.  Eyes:     Extraocular Movements: Extraocular movements intact.     Conjunctiva/sclera: Conjunctivae normal.     Pupils: Pupils are equal, round, and reactive to light.     Comments: Upper gaze test patient able to hold it for 30 seconds.  Patient does say though that she has had some visual changes even without doing this test.  Certainly by doing the upper gaze test.  There was no evidence of any lid changes.  No evidence of any weakness.  Cardiovascular:     Rate and Rhythm: Normal rate and regular rhythm.     Heart sounds: No murmur heard. Pulmonary:     Effort: Pulmonary effort is normal. No respiratory distress.     Breath sounds: Normal breath sounds. No wheezing, rhonchi or rales.  Abdominal:     General: There is no distension.     Palpations: Abdomen is soft.     Tenderness: There is no abdominal tenderness. There is no guarding.  Musculoskeletal:        General: No swelling.     Cervical back: Normal range of motion and neck supple.  Skin:    General: Skin is warm and dry.     Capillary Refill: Capillary refill takes less than 2 seconds.  Neurological:     General: No focal deficit present.     Mental Status: She is alert and oriented to person, place, and time.     Cranial Nerves: No cranial nerve deficit.     Sensory: No sensory deficit.     Motor: No weakness.  Psychiatric:        Mood and Affect: Mood normal.     ED Results / Procedures / Treatments   Labs (all labs ordered are listed, but only abnormal results are displayed) Labs Reviewed  COMPREHENSIVE METABOLIC PANEL - Abnormal; Notable for the following components:      Result Value   Calcium 10.9 (*)    Total Protein 8.8 (*)    All other components within normal limits  CBC WITH DIFFERENTIAL/PLATELET - Abnormal;  Notable for the following components:   RBC 5.22 (*)    Hemoglobin 15.7 (*)    HCT 46.2 (*)    All other components within normal limits  URINALYSIS, ROUTINE W REFLEX MICROSCOPIC    EKG EKG Interpretation  Date/Time:  Sunday May 11 2022 10:47:23 EDT Ventricular Rate:  66 PR Interval:  137 QRS Duration: 104 QT Interval:  393 QTC Calculation: 412 R Axis:   38 Text Interpretation: Sinus rhythm Low voltage, precordial leads RSR' in V1 or V2, right VCD  or RVH Confirmed by Fredia Sorrow [phone]) on 05/11/2022 11:16:09 AM  Radiology DG Chest Port 1 View  Result Date: 05/11/2022 CLINICAL DATA:  Shortness of breath. EXAM: PORTABLE CHEST 1 VIEW COMPARISON:  06/26/2011 FINDINGS: The lungs are clear without focal pneumonia, edema, pneumothorax or pleural effusion. The cardiopericardial silhouette is within normal limits for size. Interstitial markings are diffusely coarsened with chronic features. The visualized bony structures of the thorax are unremarkable. IMPRESSION: Chronic interstitial coarsening. No acute cardiopulmonary findings. Electronically Signed   By: Misty Stanley M.D.   On: 05/11/2022 10:41    Procedures Procedures    Medications Ordered in ED Medications - No data to display  ED Course/ Medical Decision Making/ A&P                           Medical Decision Making Amount and/or Complexity of Data Reviewed Labs: ordered. Radiology: ordered.  Risk Prescription drug management.   Patient's work-up here chest x-ray negative complete metabolic panel normal electrolytes normal.  CBC normal hemoglobin little concentrated 15.7 platelets are normal.  Urinalysis negative for urinary tract infection.  So no evidence of any secondary infection ongoing.  Patient did not want CT scan of the abdomen.  Patient really is wanting to be started on for for Distigmine.  We will discuss this with on-call neurology she is followed by [facility] neurology not sure if they will feel  comfortable giving the okay to initiate that treatment with her symptoms being fairly minimal.  No evidence of any respiratory distress.  Discussed with Dr. Leonel Ramsay of neurology recommending going starting her on low-dose like 30 mg 3 times a day during the waking hours and just leave it at that then she can follow-up with [facility] neurology.  And to give her warning that it can make diarrhea happen or could make her diarrhea worse.  Final Clinical Impression(s) / ED Diagnoses Final diagnoses:  Myasthenia gravis (Emily)    Rx / DC Orders ED Discharge Orders          Ordered    pyridostigmine (MESTINON) 60 MG tablet  3 times daily        05/11/22 1235              Fredia Sorrow, MD 05/11/22 1235    Fredia Sorrow, MD 05/11/22 1236

## 2022-05-11 NOTE — ED Notes (Signed)
RT follow up: Has seen [facility]/Dr. Lynford Citizen upon assessment interview.

## 2022-05-11 NOTE — ED Notes (Signed)
RT note: Pt. seen after arrival, RT assessment obtained, hx. of MG and mild intermittent Asthma, has [place] Neurologist prior[place] after questioned if has done NIF/VC before,(Neurological muscle indicators for MG exacerbation). RT to monitor, b/l b.s. clear.

## 2022-05-11 NOTE — Discharge Instructions (Signed)
Take the Pyridostigmine 30 mg 3 times a day while awake do not take it in the middle the night.  Follow-up with [facility] neurology on Tuesday.  Be aware that it could cause diarrhea or make your current diarrhea worse.

## 2022-05-11 NOTE — ED Triage Notes (Signed)
She c/o "dark stools all week" plus some nagging shortness of breath. She mentions that she was recently dx with myasthenia gravis. She denies fever, and is in no distress. Her husband is with her.

## 2022-05-13 ENCOUNTER — Telehealth: Payer: Self-pay | Admitting: *Deleted

## 2022-05-13 ENCOUNTER — Encounter: Payer: Self-pay | Admitting: Diagnostic Neuroimaging

## 2022-05-13 ENCOUNTER — Encounter: Payer: Self-pay | Admitting: *Deleted

## 2022-05-13 ENCOUNTER — Ambulatory Visit (INDEPENDENT_AMBULATORY_CARE_PROVIDER_SITE_OTHER): Payer: BC Managed Care – PPO | Admitting: Diagnostic Neuroimaging

## 2022-05-13 VITALS — BP 127/72 | Ht 61.0 in | Wt 147.4 lb

## 2022-05-13 DIAGNOSIS — G7000 Myasthenia gravis without (acute) exacerbation: Secondary | ICD-10-CM | POA: Diagnosis not present

## 2022-05-13 MED ORDER — PYRIDOSTIGMINE BROMIDE 60 MG PO TABS
30.0000 mg | ORAL_TABLET | Freq: Two times a day (BID) | ORAL | 6 refills | Status: DC
Start: 2022-05-13 — End: 2022-05-21
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
## 2022-05-13 NOTE — Telephone Encounter (Signed)
PLEASE NOTE: All timestamps contained within this report are represented as [place] Standard Time. CONFIDENTIALTY NOTICE: This fax transmission is intended only for the addressee. It contains information that is legally privileged, confidential or otherwise protected from use or disclosure. If you are not the intended recipient, you are strictly prohibited from reviewing, disclosing, copying using or disseminating any of this information or taking any action in reliance on or regarding this information. If you have received this fax in error, please notify us immediately by telephone so that we can arrange for its return to [place]. Phone: [phone], Toll-Free: [phone], Fax: [phone] Page: 1 of 2 Call Id: [id number] [place] [facility] Patient Name: Alexis Armstrong Gender: Female DOB: 1961/06/28 Age: 61 Y 1 M 24 D Return Phone Number: [phone] (Primary), [phone] (Secondary) Address[place]  [address] Client [facility] Night - Client Client Site [place] - Night Provider Alma Friendly - NP Contact Type Call Who Is Calling Patient / Member / Family / Caregiver Call Type Triage / Clinical Relationship To Patient Self Return Phone Number [phone] (Primary) Chief Complaint BREATHING - shortness of breath or sounds breathless Reason for Call Symptomatic / Request for Justice states has been wheezing years, wheezing stopped yesterday but is having trouble breathing. 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 Additional Comment allergic to prednisone Translation No Nurse Assessment Nurse: Ysidro Evert, RN, Levada Dy Date/Time (Eastern Time): 05/10/2022 1:54:33 PM Confirm and document reason for call. If symptomatic, describe symptoms. ---Caller states she is having difficulty breathing  today. No fever Does the patient have any new or worsening symptoms? ---Yes Will a triage be completed? ---Yes Related visit to physician within the last 2 weeks? ---Yes Does the PT have any chronic conditions? (i.e. diabetes, asthma, this includes High risk factors for pregnancy, etc.) ---Yes List chronic conditions. ---Myasthenia Gravis, Hashimotos Is this a behavioral health or substance abuse call? ---No Guidelines Guideline Title Affirmed Question Affirmed Notes Nurse Date/Time (Eastern Time) Breathing Difficulty [1] MODERATE difficulty breathing (e.g., speaks in phrases, SOB even at rest, pulse 100-120) AND [2] NEW-onset or WORSE than normal Earleen Reaper 05/10/2022 1:56:54 PM PLEASE NOTE: All timestamps contained within this report are represented as [place] Standard Time. CONFIDENTIALTY NOTICE: This fax transmission is intended only for the addressee. It contains information that is legally privileged, confidential or otherwise protected from use or disclosure. If you are not the intended recipient, you are strictly prohibited from reviewing, disclosing, copying using or disseminating any of this information or taking any action in reliance on or regarding this information. If you have received this fax in error, please notify us immediately by telephone so that we can arrange for its return to [place]. Phone: [phone], Toll-Free: [phone], Fax: [phone] Page: 2 of 2 Call Id: [id number] [place]. Time Eilene Ghazi Time) Disposition Final User 05/10/2022 1:52:53 PM Send to Urgent Eduard Clos 05/10/2022 2:01:31 PM Go to ED Now Yes Ysidro Evert, RN, Levada Dy Final Disposition 05/10/2022 2:01:31 PM Go to ED Now Yes Ysidro Evert, RN, Marin Shutter Disagree/Comply Comply Caller Understands Yes PreDisposition Did not know what to do Care Advice Given Per Guideline GO TO ED NOW: * You need to be seen in the Emergency Department. * Go to the ED at ___________ [place]  now. Drive carefully. CARE ADVICE given per Breathing Difficulty (Adult) guideline. * Another adult should drive. * Patient should  not delay going to the emergency department. CALL EMS 911 IF: * Call EMS if you become worse. Referrals [facility] - ED

## 2022-05-13 NOTE — Telephone Encounter (Signed)
Noted  

## 2022-05-13 NOTE — Telephone Encounter (Signed)
Patient was seen in the ER

## 2022-05-13 NOTE — Patient Instructions (Addendum)
  VISION CHANGES, DOUBLE VISION --> myasthenia gravis (ocular sxs; some gen sxs such as arm weakness, but also could be related to neck) - continue pyridostigmine; reduce to '30mg'$  twice a day (nausea and loose stools) - check CT chest (evaluate for thymoma) - consider IVIG, cellcept, vyvgart  NECK PAIN / SPINAL STENOSIS / CERVICAL RADICULOPATHY - degenerative changes noted; consider spine surgery consult / pain mgmt  HISTORY OF SHORTNESS OF BREATH / WHEEZING - consider pulmonology referral; not clearly related to myasthenia  HEADACHES (likely migraine with aura) - may consider migraine tx in future  PREDNISONE ALLERGY (unclear if true; subjective symptoms in the past) - consider follow up with allergy / immunology

## 2022-05-16 ENCOUNTER — Telehealth: Payer: Self-pay | Admitting: Primary Care

## 2022-05-16 DIAGNOSIS — G7000 Myasthenia gravis without (acute) exacerbation: Secondary | ICD-10-CM

## 2022-05-16 NOTE — Telephone Encounter (Signed)
Patient called and stated she would like her referral for Myasthenia gravis sent to Dr Narda Amber, her phone number [phone].

## 2022-05-16 NOTE — Telephone Encounter (Signed)
Called patient let her know that referral will be discussed at visit that is scheduled 9/19 to discuss with Anda Kraft.

## 2022-05-17 NOTE — Telephone Encounter (Signed)
Please notify patient that I have been reviewing her visits with the neurologist and am happy to place her referral to Dr. Posey Pronto. I am happy to meet with her as scheduled, but she doesn't have to meet with me to have this referral. Her choice.   Referral placed.

## 2022-05-17 NOTE — Addendum Note (Signed)
Addended by: Pleas Koch on: 05/17/2022 01:08 AM   Modules accepted: Orders

## 2022-05-19 NOTE — Telephone Encounter (Signed)
Called patient let her know referral has been placed. She has requested to keep appointment as scheduled with Anda Kraft.

## 2022-05-21 ENCOUNTER — Telehealth: Payer: Self-pay | Admitting: Diagnostic Neuroimaging

## 2022-05-21 MED ORDER — PYRIDOSTIGMINE BROMIDE 60 MG PO TABS: 30.0000 mg | ORAL_TABLET | Freq: Two times a day (BID) | ORAL | 6 refills | Status: DC

## 2022-05-21 NOTE — Telephone Encounter (Signed)
Pt called stating that her pharmacy informed her that they did not receive an Rx for her pyridostigmine (MESTINON) 60 MG tablet  Pt would like to know if it can be resent to her pharmacy.

## 2022-05-21 NOTE — Telephone Encounter (Signed)
Pyridostigmine Rx resent to [facility].

## 2022-05-22 ENCOUNTER — Inpatient Hospital Stay: Admission: RE | Admit: 2022-05-22 | Payer: BC Managed Care – PPO | Source: Ambulatory Visit

## 2022-05-23 ENCOUNTER — Telehealth: Payer: Self-pay | Admitting: *Deleted

## 2022-05-23 ENCOUNTER — Encounter: Payer: Self-pay | Admitting: Neurology

## 2022-05-23 DIAGNOSIS — R0683 Snoring: Secondary | ICD-10-CM

## 2022-05-23 DIAGNOSIS — R0602 Shortness of breath: Secondary | ICD-10-CM

## 2022-05-23 NOTE — Telephone Encounter (Signed)
Jettie Booze, MD  [name], RN I personally reviewed echo.  Np pulm HTN by TR jet.  No septal flattening. Normal RV size and contraction  Jettie Booze, MD  [name], RN No evidence of pulm HTN by echo. Would continue to monitor for signs of sleep apnea.  If she were to develop significant shortness of breath, right heart cath would be next step but would not pursue unless she was having significant symptoms  Reviewed with Dr Irish Lack and patient can follow up with him as needed.   Patient notified.  She reports since seeing Dr Irish Lack she has been diagnosed with myasthenia gravis.  She has been having episodes where she feels like she can't get her breath.  Slight improvement since starting on medication for myasthenia gravis but still having episodes. Feeling like she can't get her breath happens at rest and with exertion.  Does report problems sleeping recently.  Went to ED over Labor Day weekend due to shortness of breath. I told patient I would make Dr Irish Lack aware of her symptoms and we would let her know if any changes recommended

## 2022-05-23 NOTE — Telephone Encounter (Signed)
Alexis Booze, MD  [name], RN  I would recommend sleep study given snoring and shortness of breath along with question of pulm HTN based on dilated PA.  JV   Patient notified.  She would like to proceed with sleep study

## 2022-05-27 ENCOUNTER — Encounter: Payer: Self-pay | Admitting: Primary Care

## 2022-05-27 ENCOUNTER — Ambulatory Visit (INDEPENDENT_AMBULATORY_CARE_PROVIDER_SITE_OTHER): Payer: BC Managed Care – PPO | Admitting: Primary Care

## 2022-05-27 VITALS — BP 110/70 | HR 87 | Temp 97.5°F | Ht 61.0 in | Wt 148.0 lb

## 2022-05-27 DIAGNOSIS — G479 Sleep disorder, unspecified: Secondary | ICD-10-CM | POA: Diagnosis not present

## 2022-05-27 DIAGNOSIS — E039 Hypothyroidism, unspecified: Secondary | ICD-10-CM | POA: Diagnosis not present

## 2022-05-27 DIAGNOSIS — K219 Gastro-esophageal reflux disease without esophagitis: Secondary | ICD-10-CM

## 2022-05-27 DIAGNOSIS — F411 Generalized anxiety disorder: Secondary | ICD-10-CM

## 2022-05-27 DIAGNOSIS — G7000 Myasthenia gravis without (acute) exacerbation: Secondary | ICD-10-CM

## 2022-05-27 DIAGNOSIS — M542 Cervicalgia: Secondary | ICD-10-CM

## 2022-05-27 DIAGNOSIS — G8929 Other chronic pain: Secondary | ICD-10-CM

## 2022-05-27 MED ORDER — TRAZODONE HCL 50 MG PO TABS: 50.0000 mg | ORAL_TABLET | Freq: Every day | ORAL | 0 refills | Status: DC

## 2022-05-27 MED ORDER — DULOXETINE HCL 20 MG PO CPEP: 20.0000 mg | ORAL_CAPSULE | Freq: Every day | ORAL | 0 refills | Status: DC

## 2022-05-27 NOTE — Assessment & Plan Note (Signed)
Uncontrolled.  Stop famotidine 20 mg. Start omeprazole 20 mg daily.  She will update.

## 2022-05-27 NOTE — Patient Instructions (Signed)
Start duloxetine (Cymbalta) 20 mg daily for anxiety and pain.  Start trazodone 50 mg at bedtime for sleep.  Start omeprazole 20 mg daily for heartburn.   Stop by the lab prior to leaving today. I will notify you of your results once received.   It was a pleasure to see you today!

## 2022-05-27 NOTE — Assessment & Plan Note (Signed)
New diagnosis per neurology, office notes reviewed from September 2023.  She will be seeing a myasthenia gravis specialist in November 2023. Continue pyridostigmine 30 mg twice daily. Proceed with CT chest as scheduled.

## 2022-05-27 NOTE — Assessment & Plan Note (Signed)
Likely anxiety induced.  Start duloxetine 20 mg daily for anxiety. Start trazodone 50 mg at bedtime for sleep.  Close follow-up in 4 weeks.

## 2022-05-27 NOTE — Assessment & Plan Note (Signed)
We will be initiating duloxetine 20 mg daily for anxiety, hopefully this will help with pain. She will update in 4 weeks.

## 2022-05-27 NOTE — Assessment & Plan Note (Signed)
Chronic, progressed recently.  Discussed options for treatment, she opts for medication treatment. Start duloxetine 20 mg daily, hopefully this will help with anxiety and her chronic pain.  She will update in 4 weeks.

## 2022-05-27 NOTE — Assessment & Plan Note (Signed)
Repeat thyroid testing pending.  Continue levothyroxine 125 mcg 3 days weekly and levothyroxine 112 mcg 4 days weekly for now. Await lab results.  She is taking levothyroxine doses correctly.
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
## 2022-05-28 ENCOUNTER — Other Ambulatory Visit: Payer: Self-pay | Admitting: Primary Care

## 2022-05-28 ENCOUNTER — Telehealth: Payer: Self-pay | Admitting: Primary Care

## 2022-05-28 LAB — TSH: TSH: 1.38 u[IU]/mL (ref 0.35–5.50)

## 2022-05-28 LAB — T4, FREE: Free T4: 1.34 ng/dL (ref 0.60–1.60)

## 2022-05-28 LAB — T3, FREE: T3, Free: 2.7 pg/mL (ref 2.3–4.2)

## 2022-05-28 NOTE — Telephone Encounter (Signed)
Patient called back in returning a call she received. Thank you!
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
## 2022-05-29 ENCOUNTER — Ambulatory Visit
Admission: RE | Admit: 2022-05-29 | Discharge: 2022-05-29 | Disposition: A | Discharge status: Home / Self Care | Payer: BC Managed Care – PPO | Source: Ambulatory Visit | Attending: Diagnostic Neuroimaging | Admitting: Diagnostic Neuroimaging

## 2022-05-29 DIAGNOSIS — G7000 Myasthenia gravis without (acute) exacerbation: Secondary | ICD-10-CM

## 2022-05-29 MED ORDER — IOPAMIDOL (ISOVUE-300) INJECTION 61%
75.0000 mL | Freq: Once | INTRAVENOUS | Status: AC | PRN
Start: 2022-05-29 — End: 2022-05-29
  Administered 2022-05-29: 75 mL via INTRAVENOUS

## 2022-05-29 NOTE — Telephone Encounter (Signed)
See results notes have spoke to patient.  No further action needed at this time.

## 2022-05-30 ENCOUNTER — Ambulatory Visit: Payer: BC Managed Care – PPO | Admitting: Neurology

## 2022-05-30 ENCOUNTER — Encounter: Payer: Self-pay | Admitting: Neurology

## 2022-05-30 VITALS — BP 122/83 | HR 87 | Ht 61.0 in | Wt 146.0 lb

## 2022-05-30 DIAGNOSIS — R292 Abnormal reflex: Secondary | ICD-10-CM

## 2022-05-30 DIAGNOSIS — R200 Anesthesia of skin: Secondary | ICD-10-CM

## 2022-05-30 DIAGNOSIS — Z79890 Hormone replacement therapy (postmenopausal): Secondary | ICD-10-CM

## 2022-05-30 DIAGNOSIS — R1310 Dysphagia, unspecified: Secondary | ICD-10-CM

## 2022-05-30 MED ORDER — PROGESTERONE MICRONIZED 100 MG PO CAPS: 100.0000 mg | ORAL_CAPSULE | Freq: Every day | ORAL | 0 refills | Status: DC

## 2022-05-30 NOTE — Patient Instructions (Addendum)
Swallow evaluation  MRI lumbar spine  Continue mestinon as you are taking  If you would like to seek another neuromuscular opinion at an academic center, please let me know and we can place that referral.  Return to clinic in 4 months

## 2022-06-02 ENCOUNTER — Other Ambulatory Visit (HOSPITAL_COMMUNITY): Payer: Self-pay | Admitting: *Deleted

## 2022-06-02 DIAGNOSIS — R1310 Dysphagia, unspecified: Secondary | ICD-10-CM

## 2022-06-02 NOTE — Telephone Encounter (Signed)
Copied into original MyChart message

## 2022-06-02 NOTE — Telephone Encounter (Signed)
Copied from additional message  I saw the other neurologist on Friday. She had such attitude & arrogance was not even what she said it was how she said it!  It was like I was on trial with these doctors that I have to prove that even my feet that have been a problem for the last 6 yrs have been forever changed after being on medication & that it has NOTHING to do with my MG! She acted like because I wasn't on my deathbed or in hospital I was in remission! Seriously! I know my body & I know it has progressed because of my throat & swallowing that I now HAVE to be on medication &  It seems to only be better when I take my meds even my eyes change after I take my meds.

## 2022-06-02 NOTE — Telephone Encounter (Signed)
Copied from additional message  I guess for me I want to be proactive with my care not wait for such serious progression before getting on proper medication. Thank you for always making me feel cared about! I so appreciate you in every way!. So thorough & fast btw!!! Continue to be impressed by you! Thanks so much Anda Kraft! Margreta Journey

## 2022-06-02 NOTE — Telephone Encounter (Signed)
Copied from additional message I got so thrown by her I could not even finish listing pertinent symptoms like ie extreme fatigue! She said if I didn't agree with her I could go to [place] or [facility] to get evaluated there! She did not want to give me anything else for my immune system to take the place of prednisone which is normally prescribed in the beginning! The only reason the other doctor didn't give it to me it's because I am allergic & I started our whole conversation with her with that. Towards the end she said she would not not prescribe prednisone inferring because she didn't think I needed it ~ how about the fact that I told you I'm allergic! I was hoping for a nonsteroidal like cell cept(Not that I want to be on more meds) but I mentioned my throat  &  she left the the room saying "I'll get you a a swallow study"  like throwing me a bone because I clearly needed more from her! I waited a few minutes & her nurse came in with my papers to leave. I said I did not know she was not coming back & I still had questions about my ocular headaches & other medication but nurse told me they were all going to lunch she would mention it to her!. Talk about being dismissed! She was the expert & MOST of my symptoms had nothing to do with MG even though not just online but I've spoken to people who's feet problems like mine were a direct result of MG! Now I'm not sure who to see for my MG now! There was someone in [place] who was also recommended to me but I was trying to keep it local in [place]

## 2022-06-10 ENCOUNTER — Encounter (HOSPITAL_COMMUNITY): Payer: BC Managed Care – PPO

## 2022-06-10 ENCOUNTER — Ambulatory Visit (HOSPITAL_COMMUNITY): Payer: BC Managed Care – PPO

## 2022-06-11 ENCOUNTER — Ambulatory Visit: Payer: BC Managed Care – PPO | Admitting: Primary Care

## 2022-06-12 ENCOUNTER — Telehealth: Payer: Self-pay | Admitting: *Deleted

## 2022-06-12 DIAGNOSIS — Q998 Other specified chromosome abnormalities: Secondary | ICD-10-CM

## 2022-06-12 DIAGNOSIS — G7000 Myasthenia gravis without (acute) exacerbation: Secondary | ICD-10-CM

## 2022-06-12 DIAGNOSIS — R0602 Shortness of breath: Secondary | ICD-10-CM

## 2022-06-12 NOTE — Telephone Encounter (Signed)
Spoke with patient and informed her the CT chest showed no evidence of thymoma (which is what we were looking for related to myasthenia). 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 Consider PCP follow up of this finding. Otherwise continue current plan: - continue pyridostigmine; reduce to '30mg'$  twice a day (nausea and loose stools) - consider IVIG, cellcept, vyvgart Patient stated she had read that with positive MUSK antibodies vyvgart is not an option for treatment; she would like MD to clarify. She also would like referral to pulmonologist due to her continued SOB.  Patient verbalized understanding, appreciation of call.

## 2022-06-13 ENCOUNTER — Other Ambulatory Visit: Payer: BC Managed Care – PPO

## 2022-06-16 ENCOUNTER — Telehealth: Payer: Self-pay | Admitting: Diagnostic Neuroimaging

## 2022-06-16 ENCOUNTER — Ambulatory Visit (HOSPITAL_COMMUNITY)
Admission: RE | Admit: 2022-06-16 | Discharge: 2022-06-16 | Disposition: A | Discharge status: Home / Self Care | Payer: BC Managed Care – PPO | Source: Ambulatory Visit | Attending: Neurology | Admitting: Neurology

## 2022-06-16 ENCOUNTER — Ambulatory Visit (HOSPITAL_COMMUNITY)
Admission: RE | Admit: 2022-06-16 | Discharge: 2022-06-16 | Disposition: A | Discharge status: Home / Self Care | Payer: BC Managed Care – PPO | Source: Ambulatory Visit | Attending: Primary Care | Admitting: Primary Care

## 2022-06-16 DIAGNOSIS — R1310 Dysphagia, unspecified: Secondary | ICD-10-CM | POA: Diagnosis present

## 2022-06-16 DIAGNOSIS — G7000 Myasthenia gravis without (acute) exacerbation: Secondary | ICD-10-CM | POA: Insufficient documentation

## 2022-06-16 DIAGNOSIS — K219 Gastro-esophageal reflux disease without esophagitis: Secondary | ICD-10-CM | POA: Diagnosis not present

## 2022-06-16 DIAGNOSIS — E063 Autoimmune thyroiditis: Secondary | ICD-10-CM | POA: Insufficient documentation

## 2022-06-16 NOTE — Telephone Encounter (Addendum)
Called patient and informed her per Dr Leta Baptist, Vyvgart not indicated. He would recommend academic center evaluation. She request it be sent to [facility], I advised referral can be placed today. Patient verbalized understanding, appreciation.

## 2022-06-16 NOTE — Telephone Encounter (Signed)
Referral for Neurology sent to [facility]. Phone: [phone], Fax: [phone] 

## 2022-06-16 NOTE — Addendum Note (Signed)
Addended by: Minna Antis on: 06/16/2022 09:22 AM   Modules accepted: Orders

## 2022-06-24 ENCOUNTER — Telehealth: Payer: Self-pay

## 2022-06-24 ENCOUNTER — Encounter: Payer: Self-pay | Admitting: Student in an Organized Health Care Education/Training Program

## 2022-06-24 ENCOUNTER — Ambulatory Visit: Payer: BC Managed Care – PPO | Admitting: Student in an Organized Health Care Education/Training Program

## 2022-06-24 VITALS — BP 122/72 | HR 83 | Temp 98.0°F | Ht 61.0 in | Wt 151.0 lb

## 2022-06-24 DIAGNOSIS — R0602 Shortness of breath: Secondary | ICD-10-CM

## 2022-06-24 DIAGNOSIS — G7000 Myasthenia gravis without (acute) exacerbation: Secondary | ICD-10-CM

## 2022-06-24 NOTE — Telephone Encounter (Signed)
Attempted to request imaging from [facility] via power share. [facility] does not populate in power share. I have contacted ordering providers office and asked if they have assess to power share. Ivin Booty was unsure what I was referring to and requested that I contact medical records department at [phone]. Attempted to contact [facility] medical record department and received recording that office is currently closed. Will call back.
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
## 2022-06-26 ENCOUNTER — Telehealth: Payer: Self-pay | Admitting: *Deleted

## 2022-06-26 DIAGNOSIS — I272 Pulmonary hypertension, unspecified: Secondary | ICD-10-CM

## 2022-06-26 DIAGNOSIS — Q2579 Other congenital malformations of pulmonary artery: Secondary | ICD-10-CM

## 2022-06-26 DIAGNOSIS — R0683 Snoring: Secondary | ICD-10-CM

## 2022-06-26 NOTE — Telephone Encounter (Signed)
Prior Authorization for SPLIT NIGHT sent to [employer] via web portal. Tracking Number [id number].  DENIED CRITERIA NOT MET FOR SPLIT NIGHT-[id number] or [id number]. 

## 2022-06-27 NOTE — Telephone Encounter (Signed)
Attempted to call [facility] medical records- received recording that office is closed.  Will call back later today due to time difference.

## 2022-06-27 NOTE — Telephone Encounter (Signed)
Spoke to [employer] with [employer]. She stated that they do not have access to power share. A request can be faxed to [phone] and must include the following information: Name, DOB, phone number, MRN: [id number], office number, mailing address and Chest x ray 10/22/2018.   Spoke to patient and relayed need for medical request. She requested that I mail the medical release form to her address on file. She will bring form by our office once completed.   Will continue to hold to ensure follow up.

## 2022-06-27 NOTE — Telephone Encounter (Signed)
Per [employer] criteria not met for sleep studies.

## 2022-06-28 ENCOUNTER — Other Ambulatory Visit: Payer: Self-pay | Admitting: Primary Care

## 2022-06-28 DIAGNOSIS — E039 Hypothyroidism, unspecified: Secondary | ICD-10-CM

## 2022-07-02 NOTE — Telephone Encounter (Signed)
I will consult Dr Radford Pax for advisement.

## 2022-07-04 NOTE — Telephone Encounter (Signed)
Sueanne Margarita, MD to Freada Bergeron, CMA  Jettie Booze, MD      07/03/22 10:15 AM Did [address] order with the dx of pulmonary HTN - if no then resubmit with that dx and if insurance refuses then get an [place] HST

## 2022-07-04 NOTE — Telephone Encounter (Signed)
Study reordered with diagnosis of pulmonary hypertension.

## 2022-07-04 NOTE — Addendum Note (Signed)
Addended by: [name] on: 07/04/2022 09:02 AM   Modules accepted: Orders

## 2022-07-07 NOTE — Telephone Encounter (Signed)
Ok I got your message and will precert the new order.

## 2022-07-08 NOTE — Telephone Encounter (Signed)
Spoke to patient for update. She stated that she has not received medical release form yet via mail. Advised her to call back on Monday if she has not received it.

## 2022-07-17 NOTE — Telephone Encounter (Signed)
Spoke to patient. She stated that she did not receive medical release form. She would like to come by next week and sign release form. Forms has been placed up front.

## 2022-07-23 NOTE — Telephone Encounter (Signed)
Patient has not came by to sign form yet.

## 2022-07-24 NOTE — Telephone Encounter (Signed)
Spoke to patient for update. She stated that she received release form in mail last week. She will complete form and drop it by our office.

## 2022-07-30 ENCOUNTER — Ambulatory Visit: Payer: BC Managed Care – PPO | Admitting: Neurology

## 2022-08-01 ENCOUNTER — Other Ambulatory Visit: Payer: Self-pay | Admitting: Primary Care

## 2022-08-01 DIAGNOSIS — E039 Hypothyroidism, unspecified: Secondary | ICD-10-CM

## 2022-08-04 NOTE — Telephone Encounter (Signed)
Form has not been dropped yet.

## 2022-08-05 ENCOUNTER — Telehealth: Payer: Self-pay | Admitting: Student in an Organized Health Care Education/Training Program

## 2022-08-05 NOTE — Telephone Encounter (Signed)
I spoke with Alexis Armstrong and her PFT has been rescheduled on 09/04/22 @ 2:00pm arrival time 1:45pm at [facility]

## 2022-08-06 ENCOUNTER — Ambulatory Visit: Payer: BC Managed Care – PPO

## 2022-08-08 NOTE — Telephone Encounter (Signed)
Lm for pt for update.

## 2022-08-11 NOTE — Telephone Encounter (Signed)
Prior Authorization for SPLIT NIGHT sent to [facility] via web portal. ID Number [id number].Alexis [id number]

## 2022-08-12 NOTE — Telephone Encounter (Signed)
Lm x2 for patient update.  Will close encounter per office protocol.   Routing to Dr. Genia Harold as an Juluis Rainier.

## 2022-08-17 ENCOUNTER — Other Ambulatory Visit: Payer: Self-pay | Admitting: Primary Care

## 2022-08-17 DIAGNOSIS — F411 Generalized anxiety disorder: Secondary | ICD-10-CM

## 2022-08-17 DIAGNOSIS — G479 Sleep disorder, unspecified: Secondary | ICD-10-CM

## 2022-08-18 ENCOUNTER — Ambulatory Visit: Payer: BC Managed Care – PPO | Admitting: Student in an Organized Health Care Education/Training Program

## 2022-09-04 ENCOUNTER — Ambulatory Visit: Payer: BC Managed Care – PPO

## 2022-09-16 ENCOUNTER — Ambulatory Visit: Payer: BC Managed Care – PPO | Attending: Student in an Organized Health Care Education/Training Program

## 2022-09-16 ENCOUNTER — Ambulatory Visit: Payer: BC Managed Care – PPO | Admitting: Diagnostic Neuroimaging

## 2022-09-16 DIAGNOSIS — R0602 Shortness of breath: Secondary | ICD-10-CM | POA: Diagnosis present

## 2022-09-16 DIAGNOSIS — R0609 Other forms of dyspnea: Secondary | ICD-10-CM | POA: Insufficient documentation

## 2022-09-16 DIAGNOSIS — Z87891 Personal history of nicotine dependence: Secondary | ICD-10-CM | POA: Insufficient documentation

## 2022-09-16 DIAGNOSIS — R062 Wheezing: Secondary | ICD-10-CM | POA: Diagnosis not present

## 2022-09-16 LAB — PULMONARY FUNCTION TEST ARMC ONLY
DL/VA % pred: 105 %
DL/VA: 4.55 ml/min/mmHg/L
DLCO unc % pred: 86 %
DLCO unc: 15.69 ml/min/mmHg
FEF 25-75 Post: 2.07 L/sec
FEF 25-75 Pre: 1.97 L/sec
FEF2575-%Change-Post: 5 %
FEF2575-%Pred-Post: 95 %
FEF2575-%Pred-Pre: 90 %
FEV1-%Change-Post: 3 %
FEV1-%Pred-Post: 98 %
FEV1-%Pred-Pre: 95 %
FEV1-Post: 2.25 L
FEV1-Pre: 2.17 L
FEV1FVC-%Change-Post: 0 %
FEV1FVC-%Pred-Pre: 100 %
FEV6-%Change-Post: 2 %
FEV6-%Pred-Post: 99 %
FEV6-%Pred-Pre: 97 %
FEV6-Post: 2.84 L
FEV6-Pre: 2.76 L
FEV6FVC-%Pred-Post: 103 %
FEV6FVC-%Pred-Pre: 103 %
FVC-%Change-Post: 2 %
FVC-%Pred-Post: 96 %
FVC-%Pred-Pre: 93 %
FVC-Post: 2.84 L
FVC-Pre: 2.76 L
Post FEV1/FVC ratio: 79 %
Post FEV6/FVC ratio: 100 %
Pre FEV1/FVC ratio: 79 %
Pre FEV6/FVC Ratio: 100 %
RV % pred: 101 %
RV: 1.86 L
TLC % pred: 74 %
TLC: 3.44 L

## 2022-09-16 MED ORDER — ALBUTEROL SULFATE (2.5 MG/3ML) 0.083% IN NEBU
2.5000 mg | INHALATION_SOLUTION | Freq: Once | RESPIRATORY_TRACT | Status: AC
  Administered 2022-09-16: 2.5 mg via RESPIRATORY_TRACT
  Filled 2022-09-16: qty 3

## 2022-09-18 ENCOUNTER — Encounter: Payer: Self-pay | Admitting: Student in an Organized Health Care Education/Training Program

## 2022-09-18 ENCOUNTER — Ambulatory Visit: Payer: BC Managed Care – PPO | Admitting: Student in an Organized Health Care Education/Training Program

## 2022-09-18 VITALS — BP 110/76 | HR 87 | Temp 97.0°F | Ht 61.0 in | Wt 151.2 lb

## 2022-09-18 DIAGNOSIS — R0602 Shortness of breath: Secondary | ICD-10-CM | POA: Diagnosis not present

## 2022-09-18 DIAGNOSIS — G7000 Myasthenia gravis without (acute) exacerbation: Secondary | ICD-10-CM
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
## 2022-09-29 ENCOUNTER — Ambulatory Visit: Payer: BC Managed Care – PPO | Admitting: Neurology

## 2022-10-06 ENCOUNTER — Other Ambulatory Visit: Payer: Self-pay | Admitting: *Deleted

## 2022-10-06 DIAGNOSIS — E063 Autoimmune thyroiditis: Secondary | ICD-10-CM

## 2022-10-06 DIAGNOSIS — Z8249 Family history of ischemic heart disease and other diseases of the circulatory system: Secondary | ICD-10-CM

## 2022-10-17 ENCOUNTER — Ambulatory Visit: Payer: BC Managed Care – PPO

## 2022-10-20 NOTE — Telephone Encounter (Signed)
Received a message from the sleep lab that patient "[12:27 PM] Alexis Armstrong [id number] stated she doen't need to have a sleep study". Dr Irish Lack has been notified.

## 2022-10-31 ENCOUNTER — Ambulatory Visit
Admission: RE | Admit: 2022-10-31 | Discharge: 2022-10-31 | Disposition: A | Discharge status: Home / Self Care | Payer: No Typology Code available for payment source | Source: Ambulatory Visit | Attending: *Deleted | Admitting: *Deleted

## 2022-10-31 ENCOUNTER — Other Ambulatory Visit: Payer: BC Managed Care – PPO

## 2022-10-31 DIAGNOSIS — Z8249 Family history of ischemic heart disease and other diseases of the circulatory system: Secondary | ICD-10-CM

## 2022-10-31 DIAGNOSIS — E063 Autoimmune thyroiditis: Secondary | ICD-10-CM

## 2022-11-07 ENCOUNTER — Ambulatory Visit (INDEPENDENT_AMBULATORY_CARE_PROVIDER_SITE_OTHER)
Admission: RE | Admit: 2022-11-07 | Discharge: 2022-11-07 | Disposition: A | Discharge status: Home / Self Care | Payer: BC Managed Care – PPO | Source: Ambulatory Visit | Attending: Primary Care | Admitting: Primary Care

## 2022-11-07 ENCOUNTER — Encounter: Payer: Self-pay | Admitting: Primary Care

## 2022-11-07 ENCOUNTER — Ambulatory Visit (INDEPENDENT_AMBULATORY_CARE_PROVIDER_SITE_OTHER): Payer: BC Managed Care – PPO | Admitting: Primary Care

## 2022-11-07 VITALS — BP 100/68 | HR 72 | Temp 98.1°F | Ht 61.0 in | Wt 153.0 lb

## 2022-11-07 DIAGNOSIS — M25561 Pain in right knee: Secondary | ICD-10-CM

## 2022-11-07 DIAGNOSIS — M25512 Pain in left shoulder: Secondary | ICD-10-CM

## 2022-11-07 DIAGNOSIS — G8929 Other chronic pain: Secondary | ICD-10-CM | POA: Diagnosis not present

## 2022-11-07 MED ORDER — NAPROXEN 500 MG PO TABS: 500.0000 mg | ORAL_TABLET | Freq: Two times a day (BID) | ORAL | 0 refills | Status: DC | PRN
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
## 2022-11-07 NOTE — Patient Instructions (Addendum)
Complete xray(s) prior to leaving today. I will notify you of your results once received.  Start naproxen 500 mg tablets. Take 1 tablet by mouth twice daily with food as needed for pain.  Schedule a follow up visit with Dr. Lorelei Pont.  It was a pleasure to see you today!

## 2022-11-07 NOTE — Assessment & Plan Note (Signed)
Post traumatic. No obvious deformity or fracture.  Checking plain films today.  Rx for Naproxen 500 mg BID provided.  Follow up with Sports Medicine.

## 2022-11-07 NOTE — Assessment & Plan Note (Signed)
Post traumatic. Suspicious for rotator cuff tear.  Checking plain films of the left shoulder today.  Unfortunately, she has anaphylaxis to prednisone.   Start Naproxen 500 mg BID PRN. She will set up a visit with Sports Medicine.

## 2022-11-11 ENCOUNTER — Encounter: Payer: Self-pay | Admitting: Primary Care

## 2022-11-11 ENCOUNTER — Telehealth (INDEPENDENT_AMBULATORY_CARE_PROVIDER_SITE_OTHER): Payer: BC Managed Care – PPO | Admitting: Primary Care

## 2022-11-11 VITALS — Ht 61.0 in | Wt 153.0 lb

## 2022-11-11 DIAGNOSIS — G7000 Myasthenia gravis without (acute) exacerbation: Secondary | ICD-10-CM

## 2022-11-11 DIAGNOSIS — E039 Hypothyroidism, unspecified: Secondary | ICD-10-CM

## 2022-11-11 NOTE — Assessment & Plan Note (Signed)
Following with neurology through [facility]. Reviewed office notes from January 2024.  Continue pyridostigmine 30 mg 3 times daily.  Agree to complete disability paperwork for her case.

## 2022-11-11 NOTE — Progress Notes (Signed)
Patient ID: Alexis Armstrong, female    DOB: Nov 02, 1960, 62 y.o.   MRN: [id number]  Virtual visit completed through [employer], a video enabled telemedicine application. Due to national recommendations of social distancing due to COVID-19, a virtual visit is felt to be most appropriate for this patient at this time. Reviewed limitations, risks, security and privacy concerns of performing a virtual visit and the availability of in person appointments. I also reviewed that there may be a patient responsible charge related to this service. The patient agreed to proceed.   Patient location: home Provider location: [place] at [facility], office Persons participating in this virtual visit: patient, provider   If any vitals were documented, they were collected by patient at home unless specified below.    Ht '5\' 1"'$  (1.549 m)   Wt 153 lb (69.4 kg)   LMP 12/21/2012   BMI 28.91 kg/m    CC: Disability paperwork Subjective:   HPI: Alexis Armstrong is a 62 y.o. female presenting on 11/11/2022 for FMLA Paperwork  She is applying for permeant disability for a primary diagnosis of Myasthenia Gravis and a secondary diagnosis of Hashimoto thyroid disease. Symptoms began about 7 years ago. Diagnosed with Myasthenia Gravis in August/September 2023 per Dr. Stark Klein at [place].   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 Her job is physically demanding and high stress which requires her to be on her feet and moving constantly for 12-15 hour days.  Because the demands of her job she can no longer function in this fall.  She experiences full body inflammation which affects her ability to walk and be on her feet. This also affects her GI tract, balance, energy levels, stamina.   Symptoms include swelling, numbness, imbalance, and pain to her feet and toes. Putting on closed toe shoes and boots are painful due to  swelling. Heat will trigger the symptoms. She was once sleeping with ice packs to her feet while working due to her pain.   Other symptoms include bilateral upper extremity weakness, muscle pain, blurred vision with floaters which "feels like I'm looking through spider webs", migraines/headaches which "are debilitating", chronic fatigue, nausea, abdominal cramping, "explosive diarrhea".   About 2 years ago she was walking with a walker due to right groin pain and instability.  At the time she thought she pulled a groin muscle, but now she questions if she was experiencing a MG flare up as she felt that the muscle and nerves were not communicating.   She is managed on pyridostigmine 30 mg TID and levothyroxine 112 mcg and 125 mcg on differing days.  Follows with a myasthenia gravis specialist, Dr. Mikle Bosworth through [facility].      Relevant past medical, surgical, family and social history reviewed and updated as indicated. Interim medical history since our last visit reviewed. Allergies and medications reviewed and updated. Outpatient Medications Prior to Visit  Medication Sig Dispense Refill   albuterol (VENTOLIN HFA) 108 (90 Base) MCG/ACT inhaler Inhale 1-2 puffs into the lungs every 6 (six) hours as needed for wheezing or shortness of breath. 1 each 0   estradiol (VIVELLE-DOT) 0.05 MG/24HR patch Place 1 patch onto the skin 2 (two) times a week.     Levothyroxine Sodium (TIROSINT) 112 MCG CAPS TAKE 1 CAPSULE BY MOUTH EVERY DAY FOR 90 DAYS     liothyronine (CYTOMEL) 5 MCG tablet Take 5 mcg  by mouth daily.     naproxen (NAPROSYN) 500 MG tablet Take 1 tablet (500 mg total) by mouth 2 (two) times daily as needed for moderate pain. 60 tablet 0   progesterone (PROMETRIUM) 100 MG capsule Take 1 capsule (100 mg total) by mouth daily. 90 capsule 0   pyridostigmine (MESTINON) 60 MG tablet Take 0.5 tablets (30 mg total) by mouth 2 (two) times daily. (Patient taking differently: Take 30 mg by mouth 3  (three) times daily.) 60 tablet 6   valACYclovir (VALTREX) 500 MG tablet Take 1 tablet (500 mg total) by mouth daily. 90 tablet 3   levocetirizine (XYZAL) 5 MG tablet Take 5 mg by mouth every evening. (Patient not taking: Reported on 11/07/2022)     No facility-administered medications prior to visit.     Per HPI unless specifically indicated in ROS section below Review of Systems Objective:  Ht '5\' 1"'$  (1.549 m)   Wt 153 lb (69.4 kg)   LMP 12/21/2012   BMI 28.91 kg/m   Wt Readings from Last 3 Encounters:  11/11/22 153 lb (69.4 kg)  11/07/22 153 lb (69.4 kg)  09/18/22 151 lb 3.2 oz (68.6 kg)       Physical exam: General: Alert and oriented x 3, no distress, does not appear sickly  Pulmonary: Speaks in complete sentences without increased work of breathing, no cough during visit.  Psychiatric: Normal mood, thought content, and behavior.     Results for orders placed or performed in visit on 09/16/22  Pulmonary Function Test [facility] Only  Result Value Ref Range   FVC-Pre 2.76 L   FVC-%Pred-Pre 93 %   FVC-Post 2.84 L   FVC-%Pred-Post 96 %   FVC-%Change-Post 2 %   FEV1-Pre 2.17 L   FEV1-%Pred-Pre 95 %   FEV1-Post 2.25 L   FEV1-%Pred-Post 98 %   FEV1-%Change-Post 3 %   FEV6-Pre 2.76 L   FEV6-%Pred-Pre 97 %   FEV6-Post 2.84 L   FEV6-%Pred-Post 99 %   FEV6-%Change-Post 2 %   Pre FEV1/FVC ratio 79 %   FEV1FVC-%Pred-Pre 100 %   Post FEV1/FVC ratio 79 %   FEV1FVC-%Change-Post 0 %   Pre FEV6/FVC Ratio 100 %   FEV6FVC-%Pred-Pre 103 %   Post FEV6/FVC ratio 100 %   FEV6FVC-%Pred-Post 103 %   FEF 25-75 Pre 1.97 L/sec   FEF2575-%Pred-Pre 90 %   FEF 25-75 Post 2.07 L/sec   FEF2575-%Pred-Post 95 %   FEF2575-%Change-Post 5 %   RV 1.86 L   RV % pred 101 %   TLC 3.44 L   TLC % pred 74 %   DLCO unc 15.69 ml/min/mmHg   DLCO unc % pred 86 %   DL/VA 4.55 ml/min/mmHg/L   DL/VA % pred 105 %   Assessment & Plan:   Problem List Items Addressed This Visit       Endocrine    Acquired hypothyroidism    Controlled per TSH and thyroid levels from September 23.  Continue levothyroxine 125 mcg 3 days weekly and levothyroxine 112 mcg 4 days weekly.      Relevant Medications   liothyronine (CYTOMEL) 5 MCG tablet     Nervous and Auditory   Myasthenia gravis ([place]) - Primary    Following with neurology through [facility]. Reviewed office notes from January 2024.  Continue pyridostigmine 30 mg 3 times daily.  Agree to complete disability paperwork for her case.        No orders of the defined types were placed in  this encounter.  No orders of the defined types were placed in this encounter.   I discussed the assessment and treatment plan with the patient. The patient was provided an opportunity to ask questions and all were answered. The patient agreed with the plan and demonstrated an understanding of the instructions. The patient was advised to call back or seek an in-person evaluation if the symptoms worsen or if the condition fails to improve as anticipated.  Follow up plan:  I will complete your paperwork as discussed.  It was a pleasure to see you today!   Pleas Koch, NP

## 2022-11-11 NOTE — Assessment & Plan Note (Signed)
Controlled per TSH and thyroid levels from September 23.  Continue levothyroxine 125 mcg 3 days weekly and levothyroxine 112 mcg 4 days weekly.

## 2022-11-11 NOTE — Patient Instructions (Signed)
I will complete your paperwork as discussed.  It was a pleasure to see you today!
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
## 2022-11-17 ENCOUNTER — Encounter: Payer: Self-pay | Admitting: Family Medicine

## 2022-11-17 ENCOUNTER — Ambulatory Visit: Payer: BC Managed Care – PPO | Admitting: Family Medicine

## 2022-11-17 ENCOUNTER — Ambulatory Visit (INDEPENDENT_AMBULATORY_CARE_PROVIDER_SITE_OTHER): Payer: BC Managed Care – PPO | Admitting: Family Medicine

## 2022-11-17 VITALS — BP 100/70 | HR 72 | Temp 97.1°F | Ht 61.0 in | Wt 152.1 lb

## 2022-11-17 DIAGNOSIS — G7000 Myasthenia gravis without (acute) exacerbation: Secondary | ICD-10-CM | POA: Diagnosis not present

## 2022-11-17 DIAGNOSIS — R29898 Other symptoms and signs involving the musculoskeletal system: Secondary | ICD-10-CM | POA: Diagnosis not present

## 2022-11-17 DIAGNOSIS — M25561 Pain in right knee: Secondary | ICD-10-CM

## 2022-11-17 DIAGNOSIS — M7502 Adhesive capsulitis of left shoulder: Secondary | ICD-10-CM | POA: Diagnosis not present

## 2022-11-17 DIAGNOSIS — M25512 Pain in left shoulder: Secondary | ICD-10-CM | POA: Diagnosis not present

## 2022-11-17 MED ORDER — ALPRAZOLAM 0.5 MG PO TABS: ORAL_TABLET | ORAL | 0 refills | Status: DC
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
## 2022-11-18 ENCOUNTER — Encounter: Payer: Self-pay | Admitting: Family Medicine

## 2022-11-19 ENCOUNTER — Encounter: Payer: Self-pay | Admitting: *Deleted

## 2022-11-25 ENCOUNTER — Encounter: Payer: Self-pay | Admitting: Obstetrics and Gynecology

## 2022-11-25 ENCOUNTER — Ambulatory Visit (INDEPENDENT_AMBULATORY_CARE_PROVIDER_SITE_OTHER): Payer: BC Managed Care – PPO | Admitting: Obstetrics and Gynecology

## 2022-11-25 VITALS — BP 110/62 | HR 78 | Ht 61.0 in | Wt 153.0 lb

## 2022-11-25 DIAGNOSIS — R102 Pelvic and perineal pain: Secondary | ICD-10-CM | POA: Diagnosis not present

## 2022-11-25 DIAGNOSIS — Z79890 Hormone replacement therapy (postmenopausal): Secondary | ICD-10-CM

## 2022-11-25 DIAGNOSIS — Z8619 Personal history of other infectious and parasitic diseases: Secondary | ICD-10-CM | POA: Diagnosis not present

## 2022-11-25 DIAGNOSIS — Z01419 Encounter for gynecological examination (general) (routine) without abnormal findings: Secondary | ICD-10-CM | POA: Diagnosis not present

## 2022-11-25 MED ORDER — PROGESTERONE MICRONIZED 100 MG PO CAPS: 100.0000 mg | ORAL_CAPSULE | Freq: Every day | ORAL | 3 refills | Status: AC

## 2022-11-25 MED ORDER — VALACYCLOVIR HCL 500 MG PO TABS: 500.0000 mg | ORAL_TABLET | Freq: Every day | ORAL | 3 refills | Status: DC

## 2022-11-25 MED ORDER — ESTRADIOL 0.05 MG/24HR TD PTTW: 1.0000 | MEDICATED_PATCH | TRANSDERMAL | 3 refills | Status: DC

## 2022-11-25 NOTE — Patient Instructions (Signed)

## 2022-11-26 ENCOUNTER — Telehealth: Payer: Self-pay

## 2022-11-26 NOTE — Telephone Encounter (Signed)
Patient advised.  Order faxed for screening mammogram with screening bilateral breast u/s for Cat C Dense Breasts.  Patient knows they will call her to schedule. Scheduling phone number provided to patient.

## 2022-11-26 NOTE — Telephone Encounter (Signed)
Alexis Dom, MD  [facility] Triage This patient has a h/o dense breast and wants to have bilateral breast ultrasounds with her mammogram. Will [facility] do it for this indication. Cat C density.

## 2022-11-26 NOTE — Telephone Encounter (Signed)
I spoke with Alexis Armstrong at [facility] and she said they will do bilateral screening breast u/s with screening mammo for this diagnosis.  She asked me to fax order. They will upload it and someone should call her within 24 hours to schedule. They will make 3 attempts.  I will provide patient with [facility] scheduler line as well.

## 2022-12-08 ENCOUNTER — Encounter: Payer: Self-pay | Admitting: Neurology

## 2022-12-15 ENCOUNTER — Other Ambulatory Visit: Payer: BC Managed Care – PPO

## 2022-12-15 ENCOUNTER — Other Ambulatory Visit: Payer: Self-pay | Admitting: Primary Care

## 2022-12-15 DIAGNOSIS — G8929 Other chronic pain: Secondary | ICD-10-CM

## 2023-01-01 ENCOUNTER — Ambulatory Visit
Admission: RE | Admit: 2023-01-01 | Discharge: 2023-01-01 | Disposition: A | Discharge status: Home / Self Care | Payer: BC Managed Care – PPO | Source: Ambulatory Visit | Attending: Neurology | Admitting: Neurology

## 2023-01-01 DIAGNOSIS — R1310 Dysphagia, unspecified: Secondary | ICD-10-CM

## 2023-01-01 DIAGNOSIS — R292 Abnormal reflex: Secondary | ICD-10-CM

## 2023-01-01 DIAGNOSIS — R200 Anesthesia of skin: Secondary | ICD-10-CM

## 2023-01-03 ENCOUNTER — Ambulatory Visit
Admission: RE | Admit: 2023-01-03 | Discharge: 2023-01-03 | Disposition: A | Discharge status: Home / Self Care | Payer: BC Managed Care – PPO | Source: Ambulatory Visit | Attending: Family Medicine | Admitting: Family Medicine

## 2023-01-03 DIAGNOSIS — R29898 Other symptoms and signs involving the musculoskeletal system: Secondary | ICD-10-CM

## 2023-01-03 DIAGNOSIS — M25512 Pain in left shoulder: Secondary | ICD-10-CM

## 2023-01-06 ENCOUNTER — Ambulatory Visit (INDEPENDENT_AMBULATORY_CARE_PROVIDER_SITE_OTHER): Payer: BC Managed Care – PPO

## 2023-01-06 ENCOUNTER — Encounter: Payer: Self-pay | Admitting: Obstetrics and Gynecology

## 2023-01-06 ENCOUNTER — Ambulatory Visit (INDEPENDENT_AMBULATORY_CARE_PROVIDER_SITE_OTHER): Payer: BC Managed Care – PPO | Admitting: Obstetrics and Gynecology

## 2023-01-06 VITALS — BP 106/62 | HR 88 | Wt 149.0 lb

## 2023-01-06 DIAGNOSIS — R102 Pelvic and perineal pain: Secondary | ICD-10-CM

## 2023-01-06 DIAGNOSIS — N760 Acute vaginitis: Secondary | ICD-10-CM | POA: Diagnosis not present

## 2023-01-06 DIAGNOSIS — R300 Dysuria: Secondary | ICD-10-CM | POA: Diagnosis not present

## 2023-01-06 DIAGNOSIS — R109 Unspecified abdominal pain: Secondary | ICD-10-CM | POA: Diagnosis not present

## 2023-01-06 LAB — WET PREP FOR TRICH, YEAST, CLUE

## 2023-01-06 LAB — URINALYSIS, COMPLETE
Bilirubin Urine: NEGATIVE
Glucose, UA: NEGATIVE
Hgb urine dipstick: NEGATIVE
Hyaline Cast: NONE SEEN /LPF
Leukocytes,Ua: NEGATIVE
Nitrite: NEGATIVE
Protein, ur: NEGATIVE
RBC / HPF: NONE SEEN /HPF (ref 0–2)
Specific Gravity, Urine: 1.027 (ref 1.001–1.035)
pH: 5 (ref 5.0–8.0)

## 2023-01-06 MED ORDER — METRONIDAZOLE 500 MG PO TABS
500.0000 mg | ORAL_TABLET | Freq: Two times a day (BID) | ORAL | 0 refills | Status: DC
Start: 2023-01-06 — End: 2023-03-05
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
## 2023-01-06 NOTE — Patient Instructions (Signed)

## 2023-01-07 LAB — URINE CULTURE
MICRO NUMBER:: 14892957
SPECIMEN QUALITY:: ADEQUATE

## 2023-03-05 ENCOUNTER — Ambulatory Visit (INDEPENDENT_AMBULATORY_CARE_PROVIDER_SITE_OTHER): Payer: BC Managed Care – PPO | Admitting: Primary Care

## 2023-03-05 ENCOUNTER — Encounter: Payer: Self-pay | Admitting: Primary Care

## 2023-03-05 VITALS — BP 92/58 | HR 70 | Temp 97.9°F | Ht 61.0 in | Wt 153.0 lb

## 2023-03-05 DIAGNOSIS — R1011 Right upper quadrant pain: Secondary | ICD-10-CM | POA: Diagnosis not present

## 2023-03-05 DIAGNOSIS — E039 Hypothyroidism, unspecified: Secondary | ICD-10-CM

## 2023-03-05 NOTE — Assessment & Plan Note (Signed)
Unclear etiology.  History of cholecystectomy. Liver not palpable upon exam, and her pain is more laterally than anterior to the right upper abdomen. No evidence of shingles. No evidence of diverticulosis on CT scan from 2014.  Checking CMP, CBC with differential. Consider abdominal ultrasound.

## 2023-03-05 NOTE — Patient Instructions (Signed)
Stop by the lab prior to leaving today. I will notify you of your results once received.   It was a pleasure to see you today!  

## 2023-03-05 NOTE — Assessment & Plan Note (Signed)
Following with naturopathic provider. Repeat thyroid studies ordered and pending.  Continue Cytomel 5 mg daily, levothyroxine 112 mcg daily.
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
## 2023-03-06 LAB — COMPREHENSIVE METABOLIC PANEL
ALT: 19 U/L (ref 0–35)
AST: 19 U/L (ref 0–37)
Albumin: 4.5 g/dL (ref 3.5–5.2)
Alkaline Phosphatase: 80 U/L (ref 39–117)
BUN: 13 mg/dL (ref 6–23)
CO2: 27 mEq/L (ref 19–32)
Calcium: 9.7 mg/dL (ref 8.4–10.5)
Chloride: 102 mEq/L (ref 96–112)
Creatinine, Ser: 0.71 mg/dL (ref 0.40–1.20)
GFR: 92.06 mL/min (ref >60.00)
Glucose, Bld: 97 mg/dL (ref 70–99)
Potassium: 4.1 mEq/L (ref 3.5–5.1)
Sodium: 139 mEq/L (ref 135–145)
Total Bilirubin: 0.5 mg/dL (ref 0.2–1.2)
Total Protein: 7.2 g/dL (ref 6.0–8.3)

## 2023-03-06 LAB — T3, FREE: T3, Free: 3.4 pg/mL (ref 2.3–4.2)

## 2023-03-06 LAB — CBC WITH DIFFERENTIAL/PLATELET
Basophils Absolute: 0.1 10*3/uL (ref 0.0–0.1)
Basophils Relative: 0.9 % (ref 0.0–3.0)
Eosinophils Absolute: 0.1 10*3/uL (ref 0.0–0.7)
Eosinophils Relative: 1.9 % (ref 0.0–5.0)
HCT: 42.5 % (ref 36.0–46.0)
Hemoglobin: 13.9 g/dL (ref 12.0–15.0)
Lymphocytes Relative: 25.3 % (ref 12.0–46.0)
Lymphs Abs: 1.8 10*3/uL (ref 0.7–4.0)
MCHC: 32.7 g/dL (ref 30.0–36.0)
MCV: 90.9 fl (ref 78.0–100.0)
Monocytes Absolute: 0.5 10*3/uL (ref 0.1–1.0)
Monocytes Relative: 7.1 % (ref 3.0–12.0)
Neutro Abs: 4.6 10*3/uL (ref 1.4–7.7)
Neutrophils Relative %: 64.8 % (ref 43.0–77.0)
Platelets: 282 10*3/uL (ref 150.0–400.0)
RBC: 4.67 Mil/uL (ref 3.87–5.11)
RDW: 13.4 % (ref 11.5–15.5)
WBC: 7 10*3/uL (ref 4.0–10.5)

## 2023-03-06 LAB — T4, FREE: Free T4: 1.18 ng/dL (ref 0.60–1.60)

## 2023-03-06 LAB — TSH: TSH: 0.1 u[IU]/mL — ABNORMAL LOW (ref 0.35–5.50)

## 2023-03-23 ENCOUNTER — Ambulatory Visit: Payer: BC Managed Care – PPO | Admitting: Student in an Organized Health Care Education/Training Program

## 2023-04-15 ENCOUNTER — Telehealth: Payer: Self-pay | Admitting: Student in an Organized Health Care Education/Training Program
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
## 2023-04-16 NOTE — Telephone Encounter (Signed)
Patient is aware of recommendations and voiced her understanding. Nothing further needed.   

## 2023-05-10 DIAGNOSIS — Z419 Encounter for procedure for purposes other than remedying health state, unspecified: Secondary | ICD-10-CM | POA: Diagnosis not present

## 2023-05-12 ENCOUNTER — Ambulatory Visit: Payer: BC Managed Care – PPO | Attending: Student in an Organized Health Care Education/Training Program

## 2023-05-19 ENCOUNTER — Ambulatory Visit: Payer: BC Managed Care – PPO | Admitting: Student in an Organized Health Care Education/Training Program

## 2023-06-09 DIAGNOSIS — Z419 Encounter for procedure for purposes other than remedying health state, unspecified: Secondary | ICD-10-CM | POA: Diagnosis not present

## 2023-06-22 ENCOUNTER — Ambulatory Visit: Payer: BC Managed Care – PPO | Admitting: Student in an Organized Health Care Education/Training Program

## 2023-07-07 ENCOUNTER — Ambulatory Visit: Payer: BC Managed Care – PPO

## 2023-07-16 ENCOUNTER — Ambulatory Visit: Payer: BC Managed Care – PPO | Admitting: Student in an Organized Health Care Education/Training Program

## 2023-08-02 ENCOUNTER — Other Ambulatory Visit: Payer: Self-pay | Admitting: Diagnostic Neuroimaging

## 2023-08-04 ENCOUNTER — Ambulatory Visit: Payer: BC Managed Care – PPO | Admitting: Student in an Organized Health Care Education/Training Program

## 2023-08-04 ENCOUNTER — Ambulatory Visit: Payer: BC Managed Care – PPO

## 2023-08-17 ENCOUNTER — Other Ambulatory Visit: Payer: Self-pay | Admitting: Diagnostic Neuroimaging

## 2023-08-21 IMAGING — US US SOFT TISSUE
1 series · 9 of 9 positions shown · non-contrast
Comparison: None available

CLINICAL DATA: Right anterior chest wall swelling

EXAM:
ULTRASOUND OF CHEST SOFT TISSUES
TECHNIQUE: Ultrasound examination of the chest wall soft tissues was performed
in the area of clinical concern.

[Series 1: us soft tissue · 0.07mm/px · 9 of 9 slices shown]
[im 1/9]
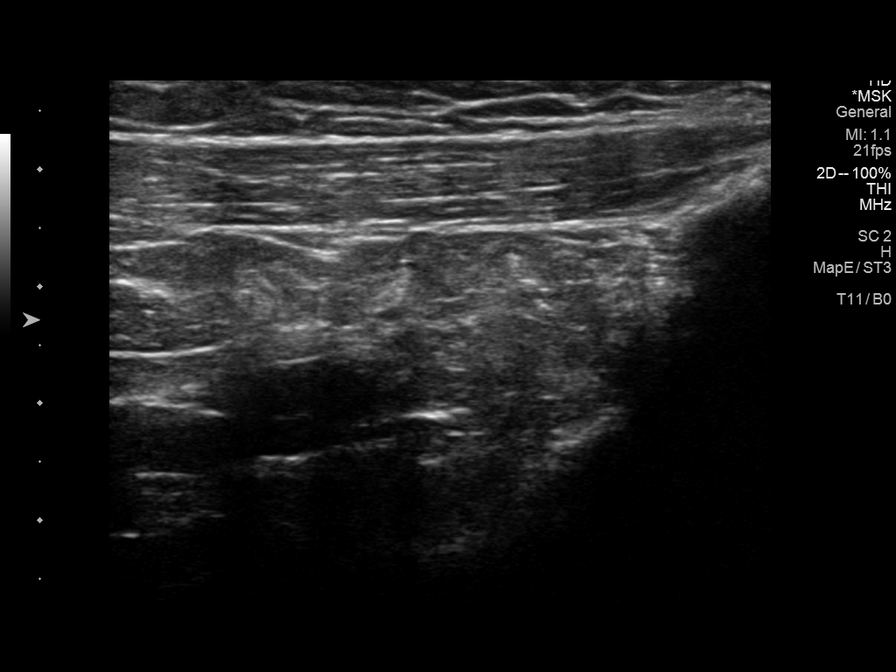
[im 2/9]
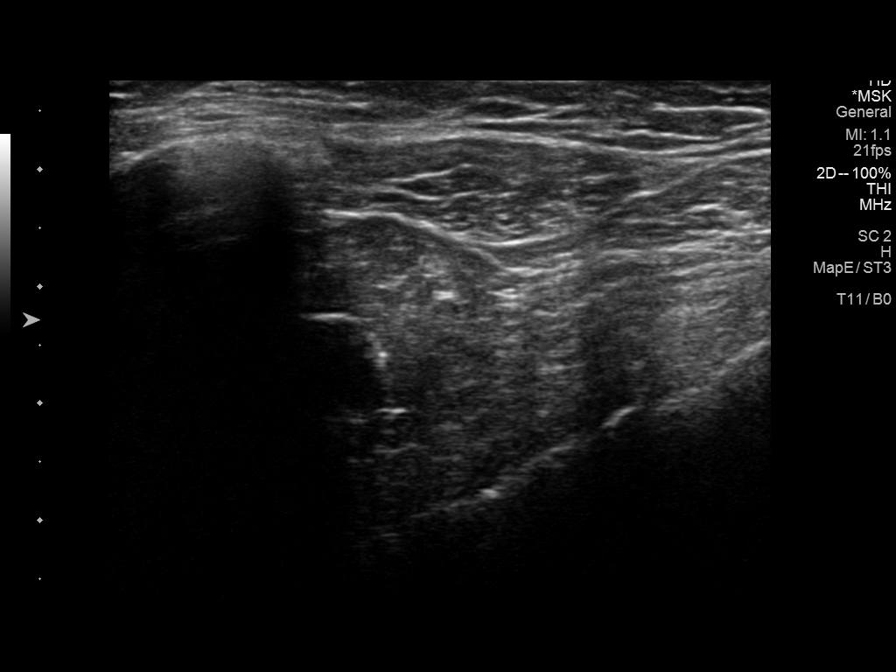
[im 3/9]
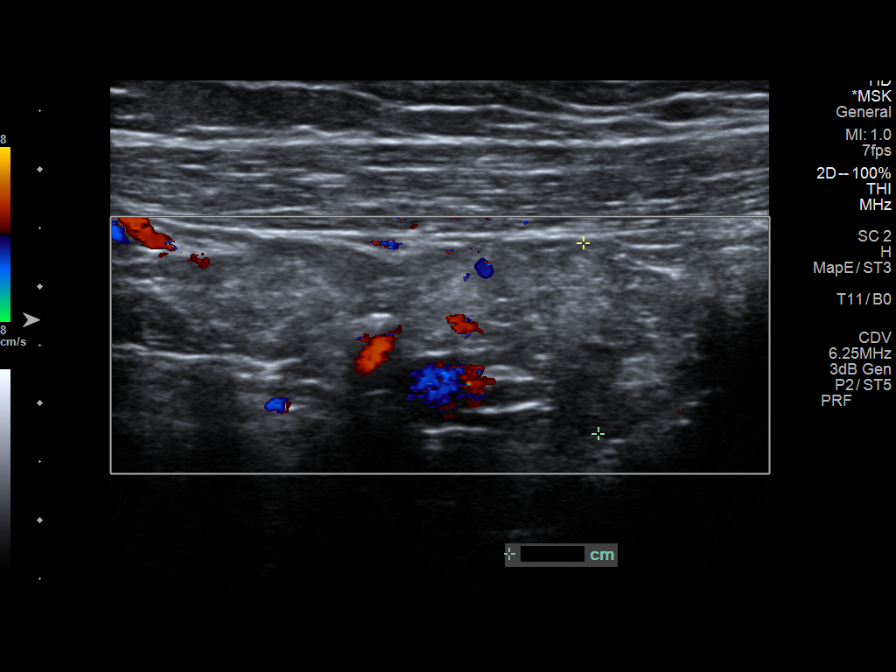
[im 4/9]
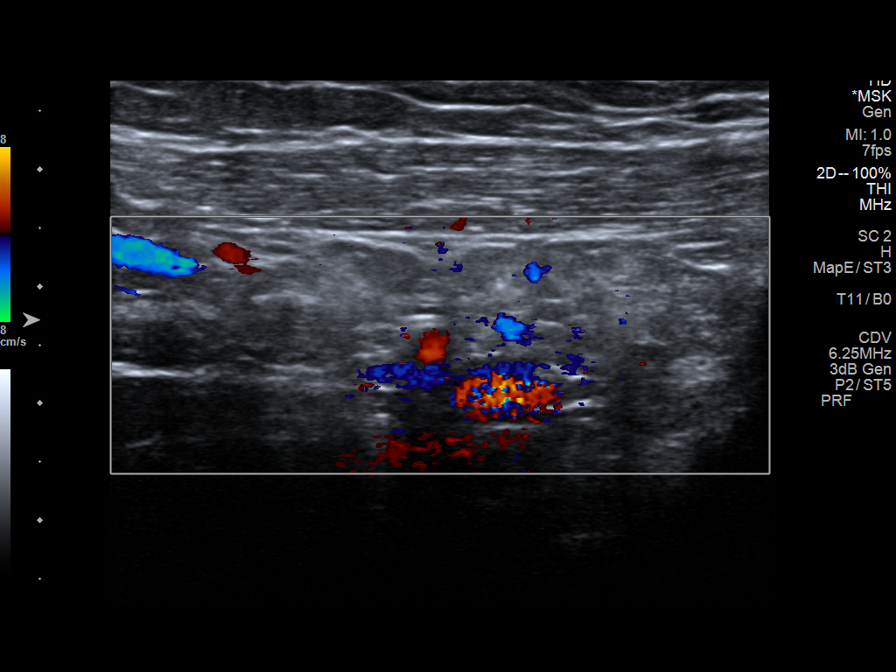
[im 5/9]
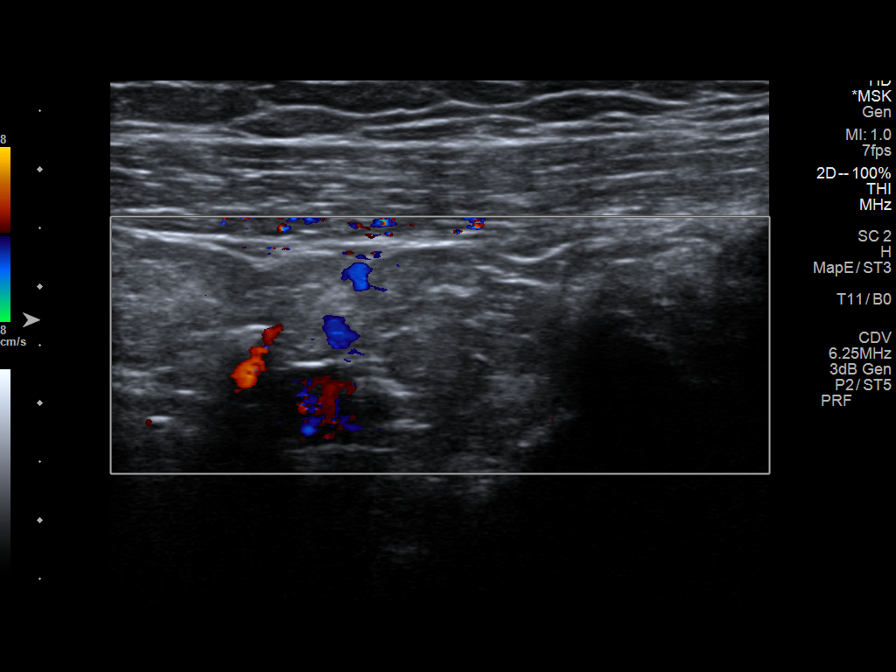
[im 6/9]
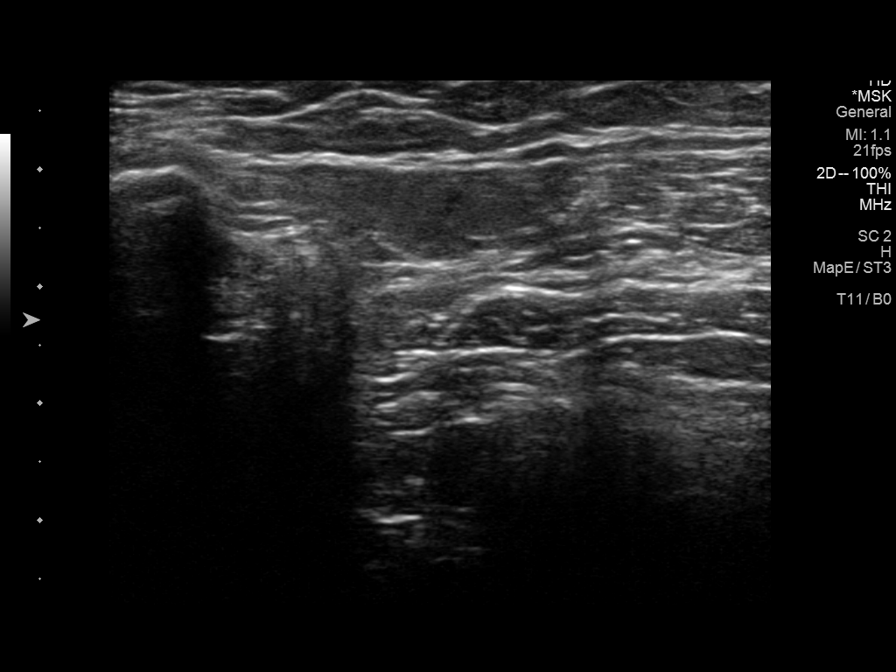
[im 7/9]
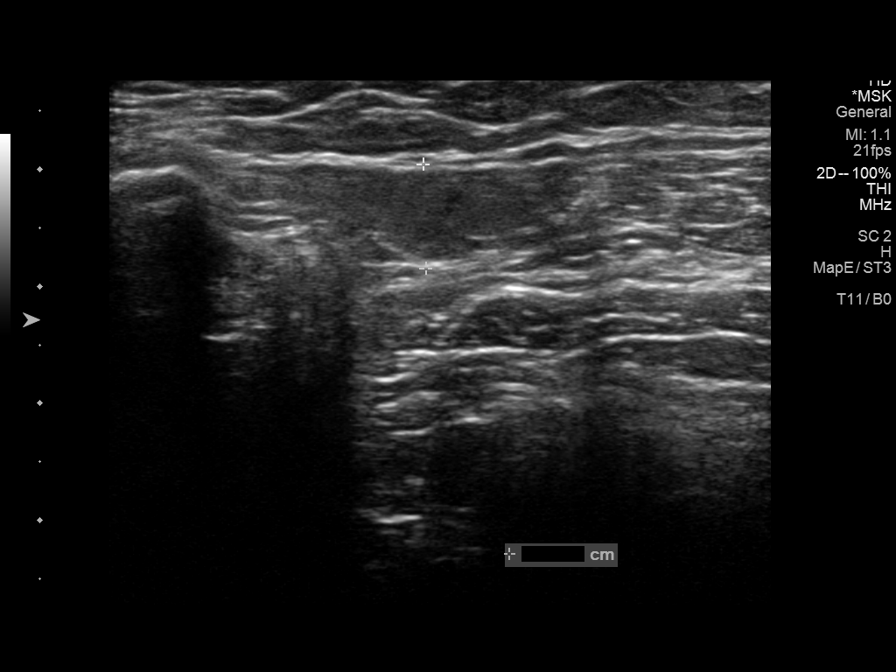
[im 8/9]
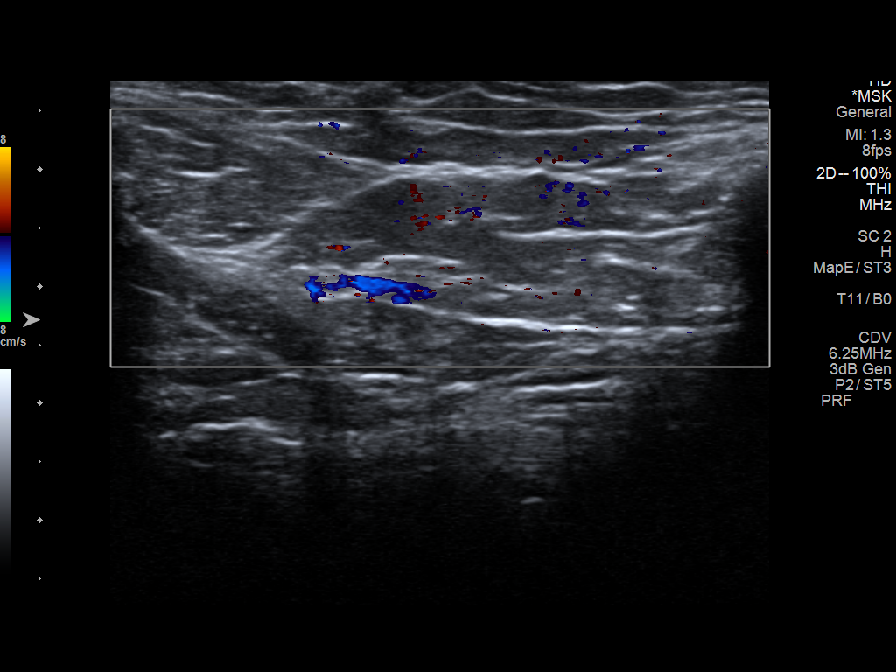
[im 9/9]
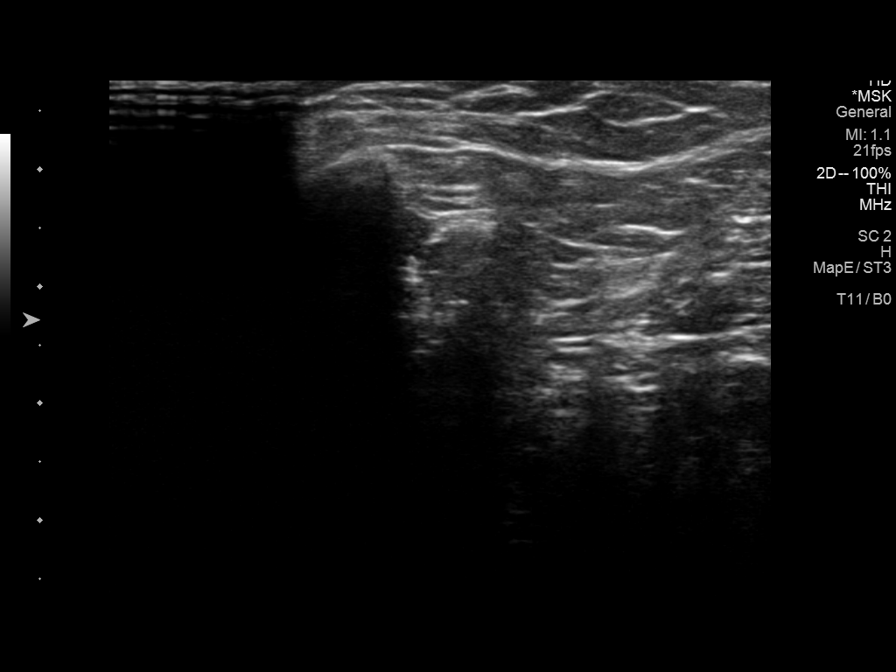

[9 of 9 positions shown; findings below may reference images not displayed]

FINDINGS: Targeted sonographic evaluation of the area of concern near the
right clavicle shows no discrete abnormality.
IMPRESSION: No discrete sonographic abnormality identified in the region of the
right clavicle. If there is continued concern for pathology at this
site, further evaluation with contrast enhanced chest CT should be
obtained.

## 2023-09-24 ENCOUNTER — Other Ambulatory Visit: Payer: Self-pay | Admitting: Student in an Organized Health Care Education/Training Program

## 2023-09-24 DIAGNOSIS — R0602 Shortness of breath: Secondary | ICD-10-CM

## 2023-10-08 ENCOUNTER — Encounter: Payer: Self-pay | Admitting: Primary Care

## 2023-10-08 ENCOUNTER — Ambulatory Visit: Payer: Medicaid Other | Admitting: Primary Care

## 2023-10-08 ENCOUNTER — Ambulatory Visit
Admission: RE | Admit: 2023-10-08 | Discharge: 2023-10-08 | Disposition: A | Discharge status: Home / Self Care | Payer: Medicaid Other | Source: Ambulatory Visit | Attending: Primary Care | Admitting: Primary Care

## 2023-10-08 VITALS — BP 90/58 | HR 81 | Temp 97.2°F | Ht 61.0 in | Wt 157.0 lb

## 2023-10-08 DIAGNOSIS — R051 Acute cough: Secondary | ICD-10-CM | POA: Insufficient documentation

## 2023-10-08 DIAGNOSIS — R0989 Other specified symptoms and signs involving the circulatory and respiratory systems: Secondary | ICD-10-CM | POA: Diagnosis not present

## 2023-10-08 NOTE — Patient Instructions (Signed)
For potential reflux induced cough:  -Famotidine (Pepcid) 20 mg nightly OR omeprazole (Prilosec) 20 mg nightly.  Cough/Chest congestion:  -Delsym DM  Throat drainage/Runny Nose:  -Claritin, Zyrtec, or Allegra.  Complete xray(s) prior to leaving today. I will notify you of your results once received.  .It was a pleasure to see you today!
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
## 2023-10-08 NOTE — Assessment & Plan Note (Signed)
Respiratory exam today overall reassuring. Fortunately, she is feeling better.  We discussed common causes for symptoms which include postviral cough, GERD.  Because of her allergies and sensitivities, treatment is limited. Discussed options including antihistamine, Delsym DM, reflux treatment.  Chest x-ray ordered and pending to rule out other causes. She will update.

## 2023-10-09 DIAGNOSIS — Z79899 Other long term (current) drug therapy: Secondary | ICD-10-CM | POA: Diagnosis not present

## 2023-10-09 DIAGNOSIS — G7000 Myasthenia gravis without (acute) exacerbation: Secondary | ICD-10-CM | POA: Diagnosis not present

## 2023-11-13 ENCOUNTER — Other Ambulatory Visit: Payer: Self-pay

## 2023-11-13 DIAGNOSIS — Z1231 Encounter for screening mammogram for malignant neoplasm of breast: Secondary | ICD-10-CM

## 2023-12-14 ENCOUNTER — Ambulatory Visit
Admission: RE | Admit: 2023-12-14 | Discharge: 2023-12-14 | Disposition: A | Discharge status: Home / Self Care | Source: Ambulatory Visit | Attending: Primary Care | Admitting: Primary Care

## 2023-12-14 DIAGNOSIS — Z1231 Encounter for screening mammogram for malignant neoplasm of breast: Secondary | ICD-10-CM

## 2023-12-17 DIAGNOSIS — R92343 Mammographic extreme density, bilateral breasts: Secondary | ICD-10-CM

## 2023-12-24 ENCOUNTER — Encounter: Payer: Self-pay | Admitting: Primary Care

## 2023-12-24 ENCOUNTER — Ambulatory Visit (INDEPENDENT_AMBULATORY_CARE_PROVIDER_SITE_OTHER)
Admission: RE | Admit: 2023-12-24 | Discharge: 2023-12-24 | Disposition: A | Discharge status: Home / Self Care | Source: Ambulatory Visit | Attending: Primary Care | Admitting: Primary Care

## 2023-12-24 ENCOUNTER — Ambulatory Visit (INDEPENDENT_AMBULATORY_CARE_PROVIDER_SITE_OTHER): Admitting: Primary Care

## 2023-12-24 VITALS — BP 112/74 | HR 71 | Temp 97.5°F | Ht 61.0 in | Wt 159.0 lb

## 2023-12-24 DIAGNOSIS — G8929 Other chronic pain: Secondary | ICD-10-CM | POA: Diagnosis not present

## 2023-12-24 DIAGNOSIS — Z8619 Personal history of other infectious and parasitic diseases: Secondary | ICD-10-CM | POA: Diagnosis not present

## 2023-12-24 DIAGNOSIS — K649 Unspecified hemorrhoids: Secondary | ICD-10-CM | POA: Diagnosis not present

## 2023-12-24 DIAGNOSIS — R222 Localized swelling, mass and lump, trunk: Secondary | ICD-10-CM

## 2023-12-24 DIAGNOSIS — M79605 Pain in left leg: Secondary | ICD-10-CM

## 2023-12-24 DIAGNOSIS — M1612 Unilateral primary osteoarthritis, left hip: Secondary | ICD-10-CM | POA: Diagnosis not present

## 2023-12-24 DIAGNOSIS — Z79890 Hormone replacement therapy (postmenopausal): Secondary | ICD-10-CM | POA: Diagnosis not present

## 2023-12-24 DIAGNOSIS — I878 Other specified disorders of veins: Secondary | ICD-10-CM | POA: Diagnosis not present

## 2023-12-24 DIAGNOSIS — R3915 Urgency of urination: Secondary | ICD-10-CM | POA: Diagnosis not present

## 2023-12-24 DIAGNOSIS — R39198 Other difficulties with micturition: Secondary | ICD-10-CM | POA: Diagnosis not present

## 2023-12-24 DIAGNOSIS — M25552 Pain in left hip: Secondary | ICD-10-CM | POA: Diagnosis not present

## 2023-12-24 LAB — URINALYSIS, MICROSCOPIC ONLY

## 2023-12-24 LAB — POC URINALSYSI DIPSTICK (AUTOMATED)
Bilirubin, UA: NEGATIVE
Blood, UA: POSITIVE
Glucose, UA: NEGATIVE
Ketones, UA: NEGATIVE
Leukocytes, UA: NEGATIVE
Nitrite, UA: NEGATIVE
Protein, UA: POSITIVE — AB
Spec Grav, UA: >=1.03 — AB (ref 1.010–1.025)
Urobilinogen, UA: 0.2 U/dL
pH, UA: 5 (ref 5.0–8.0)

## 2023-12-24 LAB — MICROALBUMIN / CREATININE URINE RATIO
Creatinine,U: 155.7 mg/dL
Microalb Creat Ratio: 6 mg/g (ref 0.0–30.0)
Microalb, Ur: 0.9 mg/dL (ref 0.0–1.9)

## 2023-12-24 MED ORDER — ESTRADIOL 0.05 MG/24HR TD PTTW
1.0000 | MEDICATED_PATCH | TRANSDERMAL | 0 refills | Status: AC
Start: 2023-12-24 — End: ?

## 2023-12-24 MED ORDER — VALACYCLOVIR HCL 500 MG PO TABS
500.0000 mg | ORAL_TABLET | Freq: Every day | ORAL | 0 refills | Status: AC
Start: 2023-12-24 — End: ?

## 2023-12-24 MED ORDER — MELOXICAM 15 MG PO TABS
15.0000 mg | ORAL_TABLET | Freq: Every day | ORAL | 0 refills | Status: DC | PRN
Start: 2023-12-24 — End: 2024-04-12

## 2023-12-24 NOTE — Patient Instructions (Addendum)
 You will either be contacted via phone regarding your referral to urology, OB/GYN, GI, physical medicine, or you may receive a letter on your MyChart portal from our referral team with instructions for scheduling an appointment. Please let us  know if you have not been contacted by anyone within two weeks.  You may take meloxicam 15 mg once daily as needed for pain/inflammation.  You will receive a phone call regarding the CT scan of your chest.  Complete xray(s) prior to leaving today. I will notify you of your results once received.  It was a pleasure to see you today!

## 2023-12-24 NOTE — Assessment & Plan Note (Signed)
 Referral placed to OB/GYN for management.  Continue estradiol patches every 2 weeks, refill provided today. Continue progesterone 100 mg daily.

## 2023-12-24 NOTE — Assessment & Plan Note (Signed)
 Referral placed for new urology practice. Urinalysis today negative.

## 2023-12-24 NOTE — Assessment & Plan Note (Signed)
 Controlled.  Continue Valtrex 500 mg daily.  Refill provided.

## 2023-12-24 NOTE — Assessment & Plan Note (Signed)
 Reviewed soft tissue ultrasound chest and CT chest from 2023. Will inquire with patient to learn if she prefers to proceed with CT chest despite negative findings previously.
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
## 2023-12-24 NOTE — Assessment & Plan Note (Signed)
 Differentials include osteoarthritis, neuropathy. Discussed options for treatment.  Referral placed to sports medicine per patient request Start meloxicam 15 mg daily as needed.

## 2023-12-31 DIAGNOSIS — M25552 Pain in left hip: Secondary | ICD-10-CM | POA: Diagnosis not present

## 2023-12-31 DIAGNOSIS — M25562 Pain in left knee: Secondary | ICD-10-CM | POA: Diagnosis not present

## 2024-01-05 ENCOUNTER — Encounter: Payer: Self-pay | Admitting: Family Medicine

## 2024-01-05 ENCOUNTER — Ambulatory Visit: Admitting: Family Medicine

## 2024-01-05 ENCOUNTER — Ambulatory Visit: Admitting: Sports Medicine

## 2024-01-05 VITALS — BP 110/70 | Ht 61.0 in | Wt 155.0 lb

## 2024-01-05 DIAGNOSIS — M25552 Pain in left hip: Secondary | ICD-10-CM | POA: Diagnosis not present

## 2024-01-05 DIAGNOSIS — M1612 Unilateral primary osteoarthritis, left hip: Secondary | ICD-10-CM | POA: Insufficient documentation

## 2024-01-05 DIAGNOSIS — M47816 Spondylosis without myelopathy or radiculopathy, lumbar region: Secondary | ICD-10-CM | POA: Diagnosis not present

## 2024-01-05 DIAGNOSIS — M47819 Spondylosis without myelopathy or radiculopathy, site unspecified: Secondary | ICD-10-CM | POA: Diagnosis not present

## 2024-01-05 NOTE — Assessment & Plan Note (Signed)
-   The patient has a known history of lumbar facet arthropathy and previously was unable to tolerate steroid injections due to a complication with her myasthenia gravis. -While awaiting for imaging she asked for recommendations for physical therapy. - I discussed at length the need for core strengthening, flexion-based therapy, and avoidance of extension rotation exercises.  I also explained the importance of hip abduction strength for stabilization. - She will follow-up with her surgeon in [facility].  Hopefully in the meantime the stent of physical therapy will give her some relief.
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
## 2024-01-05 NOTE — Assessment & Plan Note (Addendum)
-   The patient is being seen at [facility] for this and has an MRI pending - Xrays show osteoarthritis of the hip w/ a large superior osteophyte and sclerosis of the femoral head and acetabulum. The MRI was ordered because of concern for AVN.  - We discussed the treatment options for maintaining function and decreasing pain.  Unfortunately she cannot tolerate steroid injections. - Physical therapy referral given for focused hip abductor strengthening and range of motion -She does have a complicated family history of tumor growth and 1 found in her brother's knee after reported hip pain.  Due to this and some knee pain she reported we will see her back for a diagnostic ultrasound of the left knee including the rectus femoris musculature.

## 2024-01-06 ENCOUNTER — Encounter: Payer: Self-pay | Admitting: *Deleted

## 2024-01-27 ENCOUNTER — Encounter: Payer: Self-pay | Admitting: Family Medicine

## 2024-01-27 ENCOUNTER — Other Ambulatory Visit: Payer: Self-pay

## 2024-01-27 ENCOUNTER — Ambulatory Visit: Admitting: Family Medicine

## 2024-01-27 VITALS — BP 120/78 | Ht 61.0 in | Wt 155.0 lb

## 2024-01-27 DIAGNOSIS — M79605 Pain in left leg: Secondary | ICD-10-CM

## 2024-01-27 DIAGNOSIS — M7652 Patellar tendinitis, left knee: Secondary | ICD-10-CM | POA: Diagnosis not present

## 2024-01-27 DIAGNOSIS — M7650 Patellar tendinitis, unspecified knee: Secondary | ICD-10-CM | POA: Insufficient documentation
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
## 2024-01-27 NOTE — Assessment & Plan Note (Signed)
-   The patient's ultrasound show some inflammatory changes within the patellar tendon and the proximal peroneal muscle but no masses or tears.  - She plans to follow up with her surgeon in [facility] for the MRI on her left hip. It seems like her pain is largely radiating from either the lumbar spine or hip.  - She will call us  after MRI results if she would like a second opinion. Otherwise, if she is unable to get the MRI we can see her for further evaluation of the hip with ultrasound or other advanced imaging.

## 2024-02-02 DIAGNOSIS — M1612 Unilateral primary osteoarthritis, left hip: Secondary | ICD-10-CM | POA: Diagnosis not present

## 2024-02-02 DIAGNOSIS — M7062 Trochanteric bursitis, left hip: Secondary | ICD-10-CM | POA: Diagnosis not present

## 2024-02-02 DIAGNOSIS — M24152 Other articular cartilage disorders, left hip: Secondary | ICD-10-CM | POA: Diagnosis not present

## 2024-02-02 DIAGNOSIS — M94252 Chondromalacia, left hip: Secondary | ICD-10-CM | POA: Diagnosis not present

## 2024-02-02 DIAGNOSIS — M25552 Pain in left hip: Secondary | ICD-10-CM | POA: Diagnosis not present

## 2024-02-04 ENCOUNTER — Ambulatory Visit: Admitting: Urology

## 2024-02-08 ENCOUNTER — Encounter: Payer: Self-pay | Admitting: Primary Care

## 2024-02-09 ENCOUNTER — Ambulatory Visit: Admitting: Sports Medicine

## 2024-02-11 DIAGNOSIS — M1612 Unilateral primary osteoarthritis, left hip: Secondary | ICD-10-CM | POA: Diagnosis not present

## 2024-02-15 ENCOUNTER — Ambulatory Visit (INDEPENDENT_AMBULATORY_CARE_PROVIDER_SITE_OTHER): Admitting: Urology

## 2024-02-15 ENCOUNTER — Encounter: Payer: Self-pay | Admitting: Urology

## 2024-02-15 VITALS — BP 118/69 | HR 73 | Ht 61.0 in | Wt 154.0 lb

## 2024-02-15 DIAGNOSIS — R399 Unspecified symptoms and signs involving the genitourinary system: Secondary | ICD-10-CM | POA: Diagnosis not present

## 2024-02-15 DIAGNOSIS — R3915 Urgency of urination: Secondary | ICD-10-CM

## 2024-02-15 LAB — URINALYSIS, COMPLETE
Bilirubin, UA: NEGATIVE
Glucose, UA: NEGATIVE
Ketones, UA: NEGATIVE
Leukocytes,UA: NEGATIVE
Nitrite, UA: NEGATIVE
Protein,UA: NEGATIVE
Specific Gravity, UA: 1.03 (ref 1.005–1.030)
Urobilinogen, Ur: 0.2 mg/dL (ref 0.2–1.0)
pH, UA: 6 (ref 5.0–7.5)

## 2024-02-15 LAB — MICROSCOPIC EXAMINATION: Epithelial Cells (non renal): >10 /HPF — AB (ref 0–10)

## 2024-02-15 LAB — BLADDER SCAN AMB NON-IMAGING: Scan Result: 5
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
## 2024-02-18 ENCOUNTER — Ambulatory Visit (INDEPENDENT_AMBULATORY_CARE_PROVIDER_SITE_OTHER): Admitting: Primary Care

## 2024-02-18 ENCOUNTER — Encounter: Payer: Self-pay | Admitting: *Deleted

## 2024-02-18 ENCOUNTER — Ambulatory Visit: Payer: Self-pay | Admitting: Primary Care

## 2024-02-18 ENCOUNTER — Encounter: Payer: Self-pay | Admitting: Primary Care

## 2024-02-18 ENCOUNTER — Ambulatory Visit (INDEPENDENT_AMBULATORY_CARE_PROVIDER_SITE_OTHER)
Admission: RE | Admit: 2024-02-18 | Discharge: 2024-02-18 | Disposition: A | Discharge status: Home / Self Care | Source: Ambulatory Visit | Attending: Primary Care | Admitting: Primary Care

## 2024-02-18 VITALS — BP 106/70 | HR 64 | Temp 97.5°F | Ht 61.0 in | Wt 159.0 lb

## 2024-02-18 DIAGNOSIS — M79605 Pain in left leg: Secondary | ICD-10-CM | POA: Diagnosis not present

## 2024-02-18 DIAGNOSIS — M1612 Unilateral primary osteoarthritis, left hip: Secondary | ICD-10-CM | POA: Diagnosis not present

## 2024-02-18 DIAGNOSIS — M79672 Pain in left foot: Secondary | ICD-10-CM

## 2024-02-18 DIAGNOSIS — I7300 Raynaud's syndrome without gangrene: Secondary | ICD-10-CM

## 2024-02-18 DIAGNOSIS — G8929 Other chronic pain: Secondary | ICD-10-CM | POA: Diagnosis not present

## 2024-02-18 DIAGNOSIS — Z419 Encounter for procedure for purposes other than remedying health state, unspecified: Secondary | ICD-10-CM | POA: Diagnosis not present

## 2024-02-18 DIAGNOSIS — K219 Gastro-esophageal reflux disease without esophagitis: Secondary | ICD-10-CM | POA: Diagnosis not present

## 2024-02-18 DIAGNOSIS — R21 Rash and other nonspecific skin eruption: Secondary | ICD-10-CM

## 2024-02-18 DIAGNOSIS — M79671 Pain in right foot: Secondary | ICD-10-CM

## 2024-02-18 DIAGNOSIS — M2011 Hallux valgus (acquired), right foot: Secondary | ICD-10-CM | POA: Diagnosis not present

## 2024-02-18 DIAGNOSIS — M79604 Pain in right leg: Secondary | ICD-10-CM | POA: Insufficient documentation

## 2024-02-18 MED ORDER — FAMOTIDINE 20 MG PO TABS
20.0000 mg | ORAL_TABLET | Freq: Every day | ORAL | 0 refills | Status: AC | PRN
Start: 2024-02-18 — End: ?
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
## 2024-02-18 NOTE — Assessment & Plan Note (Signed)
 Xrays of bilateral feet ordered and pending.

## 2024-02-18 NOTE — Assessment & Plan Note (Addendum)
 Following with orthopedics.   Proceed with physical therapy as recommended. Potentially pending surgery.   Agree to provide prescription for Osteoboost. She will send recent MRI report of left hip.

## 2024-02-18 NOTE — Assessment & Plan Note (Signed)
 Could be secondary to osteoarthritis to the hip.   Will obtain ABIs for blood flow studies.  Proceed with physical therapy.

## 2024-02-18 NOTE — Patient Instructions (Signed)
 Complete xray(s) prior to leaving today. I will notify you of your results once received.  You will receive a phone call regarding the blood flow studies.   It was a pleasure to see you today!

## 2024-02-26 ENCOUNTER — Telehealth: Payer: Self-pay

## 2024-02-26 DIAGNOSIS — M79601 Pain in right arm: Secondary | ICD-10-CM

## 2024-02-26 DIAGNOSIS — M79602 Pain in left arm: Secondary | ICD-10-CM

## 2024-02-26 NOTE — Addendum Note (Signed)
 Addended by: Jonesha Tsuchiya K on: 02/26/2024 03:54 PM   Modules accepted: Orders

## 2024-02-26 NOTE — Telephone Encounter (Signed)
 Can we get some additional information? I'm not clear as to what she is requesting.

## 2024-02-26 NOTE — Telephone Encounter (Signed)
 Noted, see my chart message Orders placed

## 2024-02-26 NOTE — Telephone Encounter (Signed)
 Spoke with patient, she sent mychart message as well.  Patient is requesting the vascular imaging that was ordered for her lower extremities to also be ordered for her upper extremities. She has been having more numbness, redness in her fingers and now sores comes up on mainly her right hand, but also mildly on her left. States when she researched raynauds disease severe cases causes sores on the affected areas, she would like to make sure this is not what she has. She has attached picture of sore on hand in mychart message.

## 2024-02-26 NOTE — Telephone Encounter (Signed)
 Copied from CRM [id number]. Topic: Clinical - Request for Lab/Test Order >> Feb 26, 2024  9:04 AM Bambi Bonine D wrote: Reason for CRM: Pt stated that she has an appt on 6/24 at [facility]ound at [facility] [place] for her feet. Pt stated that she is having the same symptoms in her hand with numbness and sores. Pt would like request another lab order for her hands to get the same test done that she is getting for her feet. Pt would like Dr.Clark or her nurse to give her a callback today if possible.

## 2024-03-01 ENCOUNTER — Ambulatory Visit (HOSPITAL_COMMUNITY)
Admission: RE | Admit: 2024-03-01 | Discharge: 2024-03-01 | Disposition: A | Discharge status: Home / Self Care | Source: Ambulatory Visit | Attending: Vascular Surgery | Admitting: Vascular Surgery

## 2024-03-01 DIAGNOSIS — M79604 Pain in right leg: Secondary | ICD-10-CM

## 2024-03-01 DIAGNOSIS — M79605 Pain in left leg: Secondary | ICD-10-CM | POA: Diagnosis not present

## 2024-03-01 LAB — VAS US ABI WITH/WO TBI
Left ABI: 1.03
Right ABI: 0.97

## 2024-03-10 DIAGNOSIS — M1612 Unilateral primary osteoarthritis, left hip: Secondary | ICD-10-CM | POA: Diagnosis not present

## 2024-03-16 DIAGNOSIS — M5441 Lumbago with sciatica, right side: Secondary | ICD-10-CM | POA: Diagnosis not present

## 2024-03-16 DIAGNOSIS — M5442 Lumbago with sciatica, left side: Secondary | ICD-10-CM | POA: Diagnosis not present

## 2024-03-17 ENCOUNTER — Ambulatory Visit (HOSPITAL_COMMUNITY): Admission: RE | Admit: 2024-03-17 | Source: Ambulatory Visit

## 2024-03-19 DIAGNOSIS — Z419 Encounter for procedure for purposes other than remedying health state, unspecified: Secondary | ICD-10-CM | POA: Diagnosis not present

## 2024-03-25 DIAGNOSIS — N951 Menopausal and female climacteric states: Secondary | ICD-10-CM | POA: Diagnosis not present

## 2024-03-25 DIAGNOSIS — R5383 Other fatigue: Secondary | ICD-10-CM | POA: Diagnosis not present

## 2024-03-25 DIAGNOSIS — Z6828 Body mass index (BMI) 28.0-28.9, adult: Secondary | ICD-10-CM | POA: Diagnosis not present

## 2024-03-25 DIAGNOSIS — R635 Abnormal weight gain: Secondary | ICD-10-CM | POA: Diagnosis not present

## 2024-03-25 DIAGNOSIS — E7800 Pure hypercholesterolemia, unspecified: Secondary | ICD-10-CM | POA: Diagnosis not present

## 2024-03-25 DIAGNOSIS — E063 Autoimmune thyroiditis: Secondary | ICD-10-CM | POA: Diagnosis not present

## 2024-03-25 DIAGNOSIS — M1990 Unspecified osteoarthritis, unspecified site: Secondary | ICD-10-CM | POA: Diagnosis not present

## 2024-03-25 DIAGNOSIS — E559 Vitamin D deficiency, unspecified: Secondary | ICD-10-CM | POA: Diagnosis not present

## 2024-03-25 DIAGNOSIS — M25552 Pain in left hip: Secondary | ICD-10-CM | POA: Diagnosis not present

## 2024-03-25 DIAGNOSIS — G7000 Myasthenia gravis without (acute) exacerbation: Secondary | ICD-10-CM | POA: Diagnosis not present

## 2024-03-29 NOTE — Addendum Note (Signed)
 Addended by: Analya Louissaint K on: 03/29/2024 08:42 AM   Modules accepted: Orders

## 2024-03-30 ENCOUNTER — Ambulatory Visit (HOSPITAL_COMMUNITY)
Admission: RE | Admit: 2024-03-30 | Discharge: 2024-03-30 | Disposition: A | Discharge status: Home / Self Care | Source: Ambulatory Visit | Attending: Primary Care | Admitting: Primary Care

## 2024-03-30 DIAGNOSIS — M79601 Pain in right arm: Secondary | ICD-10-CM | POA: Diagnosis not present

## 2024-03-30 DIAGNOSIS — R202 Paresthesia of skin: Secondary | ICD-10-CM | POA: Insufficient documentation

## 2024-03-30 DIAGNOSIS — M79602 Pain in left arm: Secondary | ICD-10-CM | POA: Diagnosis not present

## 2024-03-31 ENCOUNTER — Ambulatory Visit: Payer: Self-pay | Admitting: Primary Care

## 2024-03-31 DIAGNOSIS — M1612 Unilateral primary osteoarthritis, left hip: Secondary | ICD-10-CM

## 2024-03-31 DIAGNOSIS — M79604 Pain in right leg: Secondary | ICD-10-CM

## 2024-03-31 DIAGNOSIS — M79602 Pain in left arm: Secondary | ICD-10-CM

## 2024-04-01 ENCOUNTER — Other Ambulatory Visit: Admitting: Urology

## 2024-04-01 DIAGNOSIS — L814 Other melanin hyperpigmentation: Secondary | ICD-10-CM | POA: Diagnosis not present

## 2024-04-01 DIAGNOSIS — R635 Abnormal weight gain: Secondary | ICD-10-CM | POA: Diagnosis not present

## 2024-04-01 DIAGNOSIS — L2084 Intrinsic (allergic) eczema: Secondary | ICD-10-CM | POA: Diagnosis not present

## 2024-04-01 DIAGNOSIS — R232 Flushing: Secondary | ICD-10-CM | POA: Diagnosis not present

## 2024-04-01 DIAGNOSIS — L821 Other seborrheic keratosis: Secondary | ICD-10-CM | POA: Diagnosis not present

## 2024-04-01 DIAGNOSIS — E663 Overweight: Secondary | ICD-10-CM | POA: Diagnosis not present

## 2024-04-01 DIAGNOSIS — R5383 Other fatigue: Secondary | ICD-10-CM | POA: Diagnosis not present

## 2024-04-01 DIAGNOSIS — E7800 Pure hypercholesterolemia, unspecified: Secondary | ICD-10-CM | POA: Diagnosis not present

## 2024-04-01 DIAGNOSIS — L299 Pruritus, unspecified: Secondary | ICD-10-CM | POA: Diagnosis not present

## 2024-04-01 DIAGNOSIS — Z6828 Body mass index (BMI) 28.0-28.9, adult: Secondary | ICD-10-CM | POA: Diagnosis not present

## 2024-04-01 DIAGNOSIS — Z1331 Encounter for screening for depression: Secondary | ICD-10-CM | POA: Diagnosis not present

## 2024-04-01 DIAGNOSIS — E559 Vitamin D deficiency, unspecified: Secondary | ICD-10-CM | POA: Diagnosis not present

## 2024-04-01 DIAGNOSIS — N951 Menopausal and female climacteric states: Secondary | ICD-10-CM | POA: Diagnosis not present

## 2024-04-01 DIAGNOSIS — Z1283 Encounter for screening for malignant neoplasm of skin: Secondary | ICD-10-CM | POA: Diagnosis not present

## 2024-04-01 DIAGNOSIS — D1801 Hemangioma of skin and subcutaneous tissue: Secondary | ICD-10-CM | POA: Diagnosis not present
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
## 2024-04-12 ENCOUNTER — Ambulatory Visit: Attending: Vascular Surgery | Admitting: Vascular Surgery

## 2024-04-12 ENCOUNTER — Encounter: Payer: Self-pay | Admitting: Vascular Surgery

## 2024-04-12 VITALS — BP 100/69 | HR 78 | Temp 98.3°F | Resp 20 | Wt 158.0 lb

## 2024-04-12 DIAGNOSIS — R200 Anesthesia of skin: Secondary | ICD-10-CM | POA: Diagnosis not present

## 2024-04-12 DIAGNOSIS — M7989 Other specified soft tissue disorders: Secondary | ICD-10-CM

## 2024-04-19 DIAGNOSIS — Z419 Encounter for procedure for purposes other than remedying health state, unspecified: Secondary | ICD-10-CM | POA: Diagnosis not present

## 2024-04-27 DIAGNOSIS — M5416 Radiculopathy, lumbar region: Secondary | ICD-10-CM | POA: Diagnosis not present

## 2024-04-29 DIAGNOSIS — M25552 Pain in left hip: Secondary | ICD-10-CM | POA: Diagnosis not present

## 2024-04-29 DIAGNOSIS — M545 Low back pain, unspecified: Secondary | ICD-10-CM | POA: Diagnosis not present

## 2024-04-29 DIAGNOSIS — M6281 Muscle weakness (generalized): Secondary | ICD-10-CM | POA: Diagnosis not present

## 2024-05-02 DIAGNOSIS — Z79899 Other long term (current) drug therapy: Secondary | ICD-10-CM | POA: Diagnosis not present

## 2024-05-02 DIAGNOSIS — G7000 Myasthenia gravis without (acute) exacerbation: Secondary | ICD-10-CM | POA: Diagnosis not present
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
## 2024-05-12 ENCOUNTER — Other Ambulatory Visit: Payer: Self-pay

## 2024-05-12 ENCOUNTER — Encounter: Payer: Self-pay | Admitting: Physical Therapy

## 2024-05-12 ENCOUNTER — Ambulatory Visit: Attending: Physician Assistant | Admitting: Physical Therapy

## 2024-05-12 ENCOUNTER — Encounter: Admitting: Vascular Surgery

## 2024-05-12 DIAGNOSIS — M25552 Pain in left hip: Secondary | ICD-10-CM | POA: Insufficient documentation

## 2024-05-12 DIAGNOSIS — M6281 Muscle weakness (generalized): Secondary | ICD-10-CM | POA: Diagnosis not present

## 2024-05-12 DIAGNOSIS — R252 Cramp and spasm: Secondary | ICD-10-CM | POA: Diagnosis present

## 2024-05-12 DIAGNOSIS — R1310 Dysphagia, unspecified: Secondary | ICD-10-CM | POA: Insufficient documentation

## 2024-05-16 ENCOUNTER — Encounter: Payer: Self-pay | Admitting: Physical Therapy
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
## 2024-05-20 ENCOUNTER — Encounter: Payer: Self-pay | Admitting: Physical Therapy

## 2024-05-20 ENCOUNTER — Ambulatory Visit: Admitting: Physical Therapy

## 2024-05-20 DIAGNOSIS — M25552 Pain in left hip: Secondary | ICD-10-CM

## 2024-05-20 DIAGNOSIS — M6281 Muscle weakness (generalized): Secondary | ICD-10-CM

## 2024-05-20 DIAGNOSIS — Z419 Encounter for procedure for purposes other than remedying health state, unspecified: Secondary | ICD-10-CM | POA: Diagnosis not present

## 2024-05-20 DIAGNOSIS — R1310 Dysphagia, unspecified: Secondary | ICD-10-CM | POA: Diagnosis not present

## 2024-05-20 DIAGNOSIS — R252 Cramp and spasm: Secondary | ICD-10-CM
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
## 2024-05-24 ENCOUNTER — Ambulatory Visit: Admitting: Physical Therapy

## 2024-05-24 ENCOUNTER — Encounter: Payer: Self-pay | Admitting: Physical Therapy

## 2024-05-24 DIAGNOSIS — M25552 Pain in left hip: Secondary | ICD-10-CM | POA: Diagnosis not present

## 2024-05-24 DIAGNOSIS — R1310 Dysphagia, unspecified: Secondary | ICD-10-CM | POA: Diagnosis not present

## 2024-05-24 DIAGNOSIS — M6281 Muscle weakness (generalized): Secondary | ICD-10-CM

## 2024-05-24 DIAGNOSIS — R252 Cramp and spasm: Secondary | ICD-10-CM

## 2024-05-26 ENCOUNTER — Ambulatory Visit: Admitting: Physical Therapy

## 2024-05-26 ENCOUNTER — Encounter: Payer: Self-pay | Admitting: Physical Therapy

## 2024-05-26 DIAGNOSIS — M25552 Pain in left hip: Secondary | ICD-10-CM | POA: Diagnosis not present

## 2024-05-26 DIAGNOSIS — M6281 Muscle weakness (generalized): Secondary | ICD-10-CM | POA: Diagnosis not present

## 2024-05-26 DIAGNOSIS — R1310 Dysphagia, unspecified: Secondary | ICD-10-CM | POA: Diagnosis not present

## 2024-05-26 DIAGNOSIS — R252 Cramp and spasm: Secondary | ICD-10-CM
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
## 2024-05-30 ENCOUNTER — Ambulatory Visit: Admitting: Physical Therapy

## 2024-05-30 ENCOUNTER — Encounter: Payer: Self-pay | Admitting: Physical Therapy

## 2024-05-30 DIAGNOSIS — R252 Cramp and spasm: Secondary | ICD-10-CM

## 2024-05-30 DIAGNOSIS — R1310 Dysphagia, unspecified: Secondary | ICD-10-CM | POA: Diagnosis not present

## 2024-05-30 DIAGNOSIS — M6281 Muscle weakness (generalized): Secondary | ICD-10-CM

## 2024-05-30 DIAGNOSIS — M25552 Pain in left hip: Secondary | ICD-10-CM
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
## 2024-06-01 ENCOUNTER — Encounter: Payer: Self-pay | Admitting: Physical Therapy

## 2024-06-01 ENCOUNTER — Ambulatory Visit: Admitting: Physical Therapy

## 2024-06-01 DIAGNOSIS — R252 Cramp and spasm: Secondary | ICD-10-CM

## 2024-06-01 DIAGNOSIS — R1310 Dysphagia, unspecified: Secondary | ICD-10-CM | POA: Diagnosis not present

## 2024-06-01 DIAGNOSIS — M25552 Pain in left hip: Secondary | ICD-10-CM | POA: Diagnosis not present

## 2024-06-01 DIAGNOSIS — M6281 Muscle weakness (generalized): Secondary | ICD-10-CM | POA: Diagnosis not present
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
## 2024-06-14 ENCOUNTER — Ambulatory Visit: Admitting: Physical Therapy

## 2024-06-16 ENCOUNTER — Encounter: Payer: Self-pay | Admitting: Physical Therapy

## 2024-06-16 ENCOUNTER — Ambulatory Visit: Attending: Physician Assistant | Admitting: Physical Therapy

## 2024-06-16 DIAGNOSIS — R252 Cramp and spasm: Secondary | ICD-10-CM | POA: Diagnosis present

## 2024-06-16 DIAGNOSIS — M25552 Pain in left hip: Secondary | ICD-10-CM | POA: Insufficient documentation

## 2024-06-16 DIAGNOSIS — M6281 Muscle weakness (generalized): Secondary | ICD-10-CM | POA: Insufficient documentation
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
## 2024-06-19 DIAGNOSIS — Z419 Encounter for procedure for purposes other than remedying health state, unspecified: Secondary | ICD-10-CM | POA: Diagnosis not present
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
## 2024-06-20 ENCOUNTER — Encounter: Payer: Self-pay | Admitting: Physical Therapy

## 2024-06-20 ENCOUNTER — Ambulatory Visit: Admitting: Physical Therapy

## 2024-06-20 ENCOUNTER — Other Ambulatory Visit: Payer: Self-pay

## 2024-06-20 DIAGNOSIS — M5416 Radiculopathy, lumbar region: Secondary | ICD-10-CM

## 2024-06-20 DIAGNOSIS — M25552 Pain in left hip: Secondary | ICD-10-CM

## 2024-06-20 DIAGNOSIS — R252 Cramp and spasm: Secondary | ICD-10-CM

## 2024-06-20 DIAGNOSIS — M6281 Muscle weakness (generalized): Secondary | ICD-10-CM
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
## 2024-06-22 ENCOUNTER — Ambulatory Visit

## 2024-06-22 DIAGNOSIS — M6281 Muscle weakness (generalized): Secondary | ICD-10-CM

## 2024-06-22 DIAGNOSIS — R252 Cramp and spasm: Secondary | ICD-10-CM

## 2024-06-22 DIAGNOSIS — M25552 Pain in left hip: Secondary | ICD-10-CM
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
## 2024-06-27 ENCOUNTER — Ambulatory Visit: Admitting: Physical Therapy

## 2024-06-27 ENCOUNTER — Encounter: Payer: Self-pay | Admitting: Physical Therapy

## 2024-06-27 DIAGNOSIS — M25552 Pain in left hip: Secondary | ICD-10-CM

## 2024-06-27 DIAGNOSIS — R252 Cramp and spasm: Secondary | ICD-10-CM

## 2024-06-27 DIAGNOSIS — M6281 Muscle weakness (generalized): Secondary | ICD-10-CM
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
## 2024-06-29 ENCOUNTER — Ambulatory Visit: Admitting: Physical Therapy

## 2024-06-29 ENCOUNTER — Encounter: Payer: Self-pay | Admitting: Physical Therapy

## 2024-06-29 ENCOUNTER — Ambulatory Visit
Admission: RE | Admit: 2024-06-29 | Discharge: 2024-06-29 | Disposition: A | Discharge status: Home / Self Care | Source: Ambulatory Visit | Attending: Neurosurgery | Admitting: Neurosurgery

## 2024-06-29 DIAGNOSIS — M6281 Muscle weakness (generalized): Secondary | ICD-10-CM

## 2024-06-29 DIAGNOSIS — M5416 Radiculopathy, lumbar region: Secondary | ICD-10-CM | POA: Insufficient documentation

## 2024-06-29 DIAGNOSIS — M25552 Pain in left hip: Secondary | ICD-10-CM | POA: Diagnosis not present

## 2024-06-29 DIAGNOSIS — R252 Cramp and spasm: Secondary | ICD-10-CM
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
## 2024-07-04 ENCOUNTER — Encounter: Payer: Self-pay | Admitting: Physical Therapy

## 2024-07-04 ENCOUNTER — Ambulatory Visit: Admitting: Family Medicine

## 2024-07-04 ENCOUNTER — Ambulatory Visit: Admitting: Physical Therapy

## 2024-07-04 DIAGNOSIS — M25552 Pain in left hip: Secondary | ICD-10-CM | POA: Diagnosis not present

## 2024-07-04 DIAGNOSIS — R252 Cramp and spasm: Secondary | ICD-10-CM

## 2024-07-04 DIAGNOSIS — M6281 Muscle weakness (generalized): Secondary | ICD-10-CM
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
## 2024-07-06 ENCOUNTER — Ambulatory Visit: Admitting: Physical Therapy

## 2024-07-06 ENCOUNTER — Encounter: Payer: Self-pay | Admitting: Physical Therapy

## 2024-07-06 DIAGNOSIS — R252 Cramp and spasm: Secondary | ICD-10-CM

## 2024-07-06 DIAGNOSIS — M25552 Pain in left hip: Secondary | ICD-10-CM

## 2024-07-06 DIAGNOSIS — M6281 Muscle weakness (generalized): Secondary | ICD-10-CM

## 2024-07-07 ENCOUNTER — Ambulatory Visit (INDEPENDENT_AMBULATORY_CARE_PROVIDER_SITE_OTHER): Admitting: Family Medicine

## 2024-07-07 VITALS — BP 118/74 | HR 79 | Ht 61.0 in | Wt 146.0 lb

## 2024-07-07 DIAGNOSIS — M25552 Pain in left hip: Secondary | ICD-10-CM | POA: Diagnosis not present

## 2024-07-07 DIAGNOSIS — M1612 Unilateral primary osteoarthritis, left hip: Secondary | ICD-10-CM | POA: Diagnosis not present

## 2024-07-07 NOTE — Patient Instructions (Addendum)
 Thank you for coming in today.   Check back for PRP

## 2024-07-07 NOTE — Progress Notes (Unsigned)
 Alexis Ileana Collet, PhD, LAT, ATC acting as a scribe for Alexis Lloyd, MD.  Alexis Armstrong is a 63 y.o. female who presents to [facility] today for L hip pain x 3-4 years. Pt locates pain to her L groin. She also notes pain also in her L buttock, and L-side of her low back.  Pt was previously seen by Dr. Janet on 4/29 for this issue  Radiates: yes-- along the posterior aspect of her entire L leg.  Aggravates: figure-4, putting her socks on Treatments tried: PT x 13 visits,   Dx imaging: 12/24/23 L hip XR  Pertinent review of systems: No fevers or chills  Relevant historical information: Pulmonary hypertension.  Myasthenia gravis.  Possible allergic reaction to steroids.   Exam:  BP 118/74   Pulse 79   Ht 5' 1 (1.549 m)   Wt 146 lb (66.2 kg)   LMP 12/21/2012   SpO2 97%   BMI 27.59 kg/m  General: Well Developed, well nourished, and in no acute distress.   MSK: Left hip decreased range of motion.  Tender palpation greater trochanter.    Lab and Radiology Results  Alexis Dover, MD - 02/07/2024 Formatting of this note might be different from the original. 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  TECHNIQUE: MRI HIP LEFT WO; Multiplanar, multisequence images were obtained on 02/02/2024 2:08 PM.  COMPARISON:  Left hip films dated December 24, 2023.    FINDINGS:  BONES / JOINTS: - No acute fractures. - No acute marrow signal abnormalities. - Moderate left hip joint effusion. - Moderate degenerative disc and facet disease of the lower lumbar spine.  LABRAL STRUCTURES / CARTILAGE: - Articular Cartilage: Essentially full-thickness cartilage loss of the posterior superior left hip joint with subchondral sclerosis and early osteochondral abnormalities posterior superior humeral head and anterior and posterior acetabulum. - Acetabular Labra: Diminutive appearance and linear increased signal intensity at the base of the left  anterior labrum with increased signal and irregularity and increased signal of the anterior superior labrum. Enlargement, increased signal and fraying of the left superior and posterior superior labrum.  MYOTENDINOUS STRUCTURES: - Tendons: Normal course, caliber and signal intensity. - Muscles: Normal signal intensity with no mass or collection.  LIGAMENTS/FASCIA: - The iliofemoral, ischiofemoral and transverse ligaments of the hip appear intact.  SOFT TISSUES / NEUROVASCULAR: - Mild fluid of the trochanteric bursae bilaterally.  ADDITIONAL COMMENTS: - None.   IMPRESSION: 1.  Severe chondromalacia and subchondral cysts and/or osteochondral abnormalities of the posterior superior humeral head and superior acetabulum bone-on-bone appearance posterior superiorly. 2.  Degenerative changes and undersurface and peripheral partial tears of the anterior and anterior superior labrum with degenerative enlargement and fraying of the superior posterior superior labrum on the left. 3.  Moderate apparent degenerative/reactive or arthritic left hip joint effusion. 4.  Trochanteric bursitis of both hips. Moderate degenerative disc and facet disease of the lower lumbar spine.  Interpretation provided by a fellowship-trained musculoskeletal radiologist.      Electronically Signed by [name], MD; 02/07/2024 11:02 PM Exam End: 02/02/24 14:49       Assessment and Plan: 63 y.o. female with left hip pain due predominantly to hip arthritis with some trochanteric bursitis.  Fundamentally are not optimistic that conservative management is going to help at this point.  She has already had a good try of physical therapy and has severe arthritis.  Her best option is probably a hip replacement but she is worried about side  effects and rejection which is a risk.  She wonders if PRP would help.  I do not think it would hurt and it possibly could help and I am happy to do PRP if she would like. She  will let me know and schedule for PRP if that makes sense for her.  PDMP not reviewed this encounter. No orders of the defined types were placed in this encounter.  No orders of the defined types were placed in this encounter.    Discussed warning signs or symptoms. Please see discharge instructions. Patient expresses understanding.   The above documentation has been reviewed and is accurate and complete Alexis Armstrong, M.D.

## 2024-07-11 ENCOUNTER — Ambulatory Visit: Attending: Physician Assistant | Admitting: Physical Therapy

## 2024-07-11 ENCOUNTER — Encounter: Payer: Self-pay | Admitting: Physical Therapy

## 2024-07-11 DIAGNOSIS — R1310 Dysphagia, unspecified: Secondary | ICD-10-CM | POA: Insufficient documentation

## 2024-07-11 DIAGNOSIS — M6281 Muscle weakness (generalized): Secondary | ICD-10-CM | POA: Insufficient documentation

## 2024-07-11 DIAGNOSIS — M25552 Pain in left hip: Secondary | ICD-10-CM | POA: Insufficient documentation

## 2024-07-11 DIAGNOSIS — R252 Cramp and spasm: Secondary | ICD-10-CM | POA: Insufficient documentation
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
## 2024-07-18 ENCOUNTER — Ambulatory Visit: Admitting: Physical Therapy

## 2024-07-18 ENCOUNTER — Encounter: Payer: Self-pay | Admitting: Physical Therapy

## 2024-07-18 DIAGNOSIS — M25552 Pain in left hip: Secondary | ICD-10-CM

## 2024-07-18 DIAGNOSIS — M6281 Muscle weakness (generalized): Secondary | ICD-10-CM

## 2024-07-18 DIAGNOSIS — R252 Cramp and spasm: Secondary | ICD-10-CM
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
## 2024-07-21 ENCOUNTER — Encounter: Payer: Self-pay | Admitting: Physical Therapy

## 2024-07-21 ENCOUNTER — Ambulatory Visit: Admitting: Physical Therapy

## 2024-07-21 DIAGNOSIS — R252 Cramp and spasm: Secondary | ICD-10-CM

## 2024-07-21 DIAGNOSIS — M25552 Pain in left hip: Secondary | ICD-10-CM | POA: Diagnosis not present

## 2024-07-21 DIAGNOSIS — M6281 Muscle weakness (generalized): Secondary | ICD-10-CM
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
## 2024-07-25 ENCOUNTER — Encounter: Payer: Self-pay | Admitting: Physical Therapy

## 2024-07-25 ENCOUNTER — Ambulatory Visit: Admitting: Physical Therapy

## 2024-07-25 DIAGNOSIS — R252 Cramp and spasm: Secondary | ICD-10-CM

## 2024-07-25 DIAGNOSIS — M25552 Pain in left hip: Secondary | ICD-10-CM

## 2024-07-25 DIAGNOSIS — M6281 Muscle weakness (generalized): Secondary | ICD-10-CM

## 2024-07-27 ENCOUNTER — Ambulatory Visit: Admitting: Physical Therapy

## 2024-07-27 ENCOUNTER — Encounter: Payer: Self-pay | Admitting: Physical Therapy

## 2024-07-27 DIAGNOSIS — M25552 Pain in left hip: Secondary | ICD-10-CM

## 2024-07-27 DIAGNOSIS — R252 Cramp and spasm: Secondary | ICD-10-CM

## 2024-07-27 DIAGNOSIS — M6281 Muscle weakness (generalized): Secondary | ICD-10-CM
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
## 2024-08-01 ENCOUNTER — Ambulatory Visit: Admitting: Physical Therapy

## 2024-08-01 ENCOUNTER — Encounter: Payer: Self-pay | Admitting: Physical Therapy

## 2024-08-01 DIAGNOSIS — M25552 Pain in left hip: Secondary | ICD-10-CM | POA: Diagnosis not present

## 2024-08-01 DIAGNOSIS — R252 Cramp and spasm: Secondary | ICD-10-CM

## 2024-08-01 DIAGNOSIS — M6281 Muscle weakness (generalized): Secondary | ICD-10-CM
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
## 2024-08-09 ENCOUNTER — Ambulatory Visit: Attending: Physician Assistant | Admitting: Physical Therapy

## 2024-08-09 ENCOUNTER — Encounter: Payer: Self-pay | Admitting: Physical Therapy

## 2024-08-09 DIAGNOSIS — R293 Abnormal posture: Secondary | ICD-10-CM | POA: Diagnosis present

## 2024-08-09 DIAGNOSIS — M6281 Muscle weakness (generalized): Secondary | ICD-10-CM | POA: Insufficient documentation

## 2024-08-09 DIAGNOSIS — M25552 Pain in left hip: Secondary | ICD-10-CM | POA: Insufficient documentation

## 2024-08-09 DIAGNOSIS — R252 Cramp and spasm: Secondary | ICD-10-CM | POA: Diagnosis present

## 2024-08-09 DIAGNOSIS — R262 Difficulty in walking, not elsewhere classified: Secondary | ICD-10-CM | POA: Diagnosis present
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
## 2024-08-12 ENCOUNTER — Ambulatory Visit: Admitting: Physical Therapy

## 2024-08-15 ENCOUNTER — Encounter: Payer: Self-pay | Admitting: Physical Therapy

## 2024-08-15 ENCOUNTER — Ambulatory Visit: Admitting: Physical Therapy

## 2024-08-15 DIAGNOSIS — M25552 Pain in left hip: Secondary | ICD-10-CM

## 2024-08-15 DIAGNOSIS — R252 Cramp and spasm: Secondary | ICD-10-CM

## 2024-08-15 DIAGNOSIS — M6281 Muscle weakness (generalized): Secondary | ICD-10-CM
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
## 2024-08-17 ENCOUNTER — Ambulatory Visit: Admitting: Physical Therapy

## 2024-08-17 ENCOUNTER — Encounter: Payer: Self-pay | Admitting: Physical Therapy

## 2024-08-17 DIAGNOSIS — M6281 Muscle weakness (generalized): Secondary | ICD-10-CM

## 2024-08-17 DIAGNOSIS — M25552 Pain in left hip: Secondary | ICD-10-CM | POA: Diagnosis not present

## 2024-08-17 DIAGNOSIS — R252 Cramp and spasm: Secondary | ICD-10-CM
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
## 2024-08-19 DIAGNOSIS — Z419 Encounter for procedure for purposes other than remedying health state, unspecified: Secondary | ICD-10-CM | POA: Diagnosis not present

## 2024-08-22 ENCOUNTER — Ambulatory Visit: Admitting: Physical Therapy

## 2024-08-24 ENCOUNTER — Encounter: Payer: Self-pay | Admitting: Physical Therapy

## 2024-08-24 ENCOUNTER — Ambulatory Visit: Admitting: Physical Therapy

## 2024-08-24 DIAGNOSIS — M6281 Muscle weakness (generalized): Secondary | ICD-10-CM

## 2024-08-24 DIAGNOSIS — M25552 Pain in left hip: Secondary | ICD-10-CM | POA: Diagnosis not present

## 2024-08-24 DIAGNOSIS — R252 Cramp and spasm: Secondary | ICD-10-CM
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
## 2024-08-25 ENCOUNTER — Ambulatory Visit: Admitting: Physical Therapy

## 2024-08-29 ENCOUNTER — Encounter: Payer: Self-pay | Admitting: Physical Therapy

## 2024-08-29 ENCOUNTER — Ambulatory Visit: Admitting: Physical Therapy

## 2024-08-29 DIAGNOSIS — R252 Cramp and spasm: Secondary | ICD-10-CM

## 2024-08-29 DIAGNOSIS — M6281 Muscle weakness (generalized): Secondary | ICD-10-CM

## 2024-08-29 DIAGNOSIS — M25552 Pain in left hip: Secondary | ICD-10-CM
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
## 2024-09-06 ENCOUNTER — Ambulatory Visit: Admitting: Physical Therapy

## 2024-09-07 ENCOUNTER — Ambulatory Visit

## 2024-09-07 DIAGNOSIS — M25552 Pain in left hip: Secondary | ICD-10-CM

## 2024-09-07 DIAGNOSIS — R252 Cramp and spasm: Secondary | ICD-10-CM

## 2024-09-07 DIAGNOSIS — R262 Difficulty in walking, not elsewhere classified: Secondary | ICD-10-CM

## 2024-09-07 DIAGNOSIS — R293 Abnormal posture: Secondary | ICD-10-CM

## 2024-09-07 DIAGNOSIS — M6281 Muscle weakness (generalized): Secondary | ICD-10-CM
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
## 2024-09-14 ENCOUNTER — Ambulatory Visit: Admitting: Physical Therapy

## 2024-09-15 ENCOUNTER — Ambulatory Visit: Attending: Physician Assistant | Admitting: Physical Therapy

## 2024-09-15 ENCOUNTER — Encounter: Payer: Self-pay | Admitting: Physical Therapy

## 2024-09-15 DIAGNOSIS — M25552 Pain in left hip: Secondary | ICD-10-CM | POA: Diagnosis present

## 2024-09-15 DIAGNOSIS — R293 Abnormal posture: Secondary | ICD-10-CM | POA: Insufficient documentation

## 2024-09-15 DIAGNOSIS — R252 Cramp and spasm: Secondary | ICD-10-CM | POA: Diagnosis present

## 2024-09-15 DIAGNOSIS — M6281 Muscle weakness (generalized): Secondary | ICD-10-CM | POA: Insufficient documentation

## 2024-09-15 DIAGNOSIS — R262 Difficulty in walking, not elsewhere classified: Secondary | ICD-10-CM | POA: Insufficient documentation

## 2024-09-15 NOTE — Patient Instructions (Signed)
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
## 2024-09-21 ENCOUNTER — Ambulatory Visit: Admitting: Physical Therapy

## 2024-09-21 ENCOUNTER — Encounter: Payer: Self-pay | Admitting: Physical Therapy

## 2024-09-21 DIAGNOSIS — R252 Cramp and spasm: Secondary | ICD-10-CM

## 2024-09-21 DIAGNOSIS — M25552 Pain in left hip: Secondary | ICD-10-CM | POA: Diagnosis not present

## 2024-09-21 DIAGNOSIS — R262 Difficulty in walking, not elsewhere classified: Secondary | ICD-10-CM

## 2024-09-21 DIAGNOSIS — M6281 Muscle weakness (generalized): Secondary | ICD-10-CM

## 2024-09-21 DIAGNOSIS — R293 Abnormal posture: Secondary | ICD-10-CM
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
## 2024-09-28 ENCOUNTER — Encounter: Payer: Self-pay | Admitting: Physical Therapy

## 2024-09-28 ENCOUNTER — Ambulatory Visit: Admitting: Physical Therapy

## 2024-09-28 DIAGNOSIS — M25552 Pain in left hip: Secondary | ICD-10-CM

## 2024-09-28 DIAGNOSIS — R252 Cramp and spasm: Secondary | ICD-10-CM

## 2024-09-28 DIAGNOSIS — M6281 Muscle weakness (generalized): Secondary | ICD-10-CM

## 2024-09-28 DIAGNOSIS — R293 Abnormal posture: Secondary | ICD-10-CM

## 2024-09-28 DIAGNOSIS — R262 Difficulty in walking, not elsewhere classified: Secondary | ICD-10-CM
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
## 2024-10-04 ENCOUNTER — Ambulatory Visit: Admitting: Physical Therapy

## 2024-10-10 ENCOUNTER — Ambulatory Visit: Admitting: Physical Therapy

## 2024-10-19 ENCOUNTER — Ambulatory Visit: Attending: Physician Assistant | Admitting: Physical Therapy
